# Patient Record
Sex: Male | Born: 1970 | Race: Black or African American | Hispanic: No | Marital: Married | Smoking: Former smoker
Health system: Southern US, Community
[De-identification: ages and names within clinical notes are randomized; demographics above are authoritative.]

## PROBLEM LIST (undated history)

## (undated) DIAGNOSIS — R110 Nausea: Secondary | ICD-10-CM

## (undated) DIAGNOSIS — K5000 Crohn's disease of small intestine without complications: Secondary | ICD-10-CM

## (undated) DIAGNOSIS — M6281 Muscle weakness (generalized): Secondary | ICD-10-CM

## (undated) DIAGNOSIS — K644 Residual hemorrhoidal skin tags: Secondary | ICD-10-CM

## (undated) DIAGNOSIS — K219 Gastro-esophageal reflux disease without esophagitis: Secondary | ICD-10-CM

## (undated) DIAGNOSIS — Z9189 Other specified personal risk factors, not elsewhere classified: Secondary | ICD-10-CM

## (undated) DIAGNOSIS — K2970 Gastritis, unspecified, without bleeding: Secondary | ICD-10-CM

## (undated) DIAGNOSIS — F419 Anxiety disorder, unspecified: Secondary | ICD-10-CM

## (undated) DIAGNOSIS — F329 Major depressive disorder, single episode, unspecified: Secondary | ICD-10-CM

## (undated) DIAGNOSIS — R109 Unspecified abdominal pain: Secondary | ICD-10-CM

## (undated) DIAGNOSIS — M1990 Unspecified osteoarthritis, unspecified site: Secondary | ICD-10-CM

## (undated) DIAGNOSIS — R202 Paresthesia of skin: Secondary | ICD-10-CM

## (undated) DIAGNOSIS — R200 Anesthesia of skin: Secondary | ICD-10-CM

## (undated) DIAGNOSIS — S92902A Unspecified fracture of left foot, initial encounter for closed fracture: Secondary | ICD-10-CM

## (undated) DIAGNOSIS — F32A Depression, unspecified: Secondary | ICD-10-CM

## (undated) DIAGNOSIS — J309 Allergic rhinitis, unspecified: Secondary | ICD-10-CM

## (undated) DIAGNOSIS — Z227 Latent tuberculosis: Secondary | ICD-10-CM

## (undated) DIAGNOSIS — N200 Calculus of kidney: Secondary | ICD-10-CM

## (undated) DIAGNOSIS — F1911 Other psychoactive substance abuse, in remission: Secondary | ICD-10-CM

## (undated) DIAGNOSIS — R51 Headache: Secondary | ICD-10-CM

## (undated) DIAGNOSIS — S82899A Other fracture of unspecified lower leg, initial encounter for closed fracture: Secondary | ICD-10-CM

## (undated) DIAGNOSIS — K648 Other hemorrhoids: Secondary | ICD-10-CM

## (undated) DIAGNOSIS — F1910 Other psychoactive substance abuse, uncomplicated: Secondary | ICD-10-CM

## (undated) HISTORY — DX: Other psychoactive substance abuse, uncomplicated: F19.10

## (undated) HISTORY — DX: Anxiety disorder, unspecified: F41.9

## (undated) HISTORY — DX: Other hemorrhoids: K64.8

## (undated) HISTORY — PX: TONSILLECTOMY: SUR1361

## (undated) HISTORY — DX: Depression, unspecified: F32.A

## (undated) HISTORY — DX: Other specified personal risk factors, not elsewhere classified: Z91.89

## (undated) HISTORY — PX: MOUTH SURGERY: SHX715

## (undated) HISTORY — DX: Gastritis, unspecified, without bleeding: K29.70

## (undated) HISTORY — DX: Residual hemorrhoidal skin tags: K64.4

## (undated) HISTORY — DX: Calculus of kidney: N20.0

## (undated) HISTORY — DX: Latent tuberculosis: Z22.7

## (undated) HISTORY — DX: Other psychoactive substance abuse, in remission: F19.11

## (undated) HISTORY — DX: Major depressive disorder, single episode, unspecified: F32.9

## (undated) HISTORY — DX: Unspecified osteoarthritis, unspecified site: M19.90

## (undated) HISTORY — DX: Crohn's disease of small intestine without complications: K50.00

## (undated) HISTORY — DX: Unspecified fracture of left foot, initial encounter for closed fracture: S92.902A

## (undated) HISTORY — PX: FOOT SURGERY: SHX648

---

## 2002-10-27 DIAGNOSIS — Z9189 Other specified personal risk factors, not elsewhere classified: Secondary | ICD-10-CM

## 2002-10-27 HISTORY — DX: Other specified personal risk factors, not elsewhere classified: Z91.89

## 2002-11-17 ENCOUNTER — Emergency Department (HOSPITAL_COMMUNITY): Admission: EM | Admit: 2002-11-17 | Discharge: 2002-11-17 | Payer: Self-pay | Admitting: Emergency Medicine

## 2002-11-17 ENCOUNTER — Inpatient Hospital Stay (HOSPITAL_COMMUNITY): Admission: EM | Admit: 2002-11-17 | Discharge: 2002-11-22 | Payer: Self-pay | Admitting: Psychiatry

## 2002-12-18 ENCOUNTER — Emergency Department (HOSPITAL_COMMUNITY): Admission: EM | Admit: 2002-12-18 | Discharge: 2002-12-18 | Payer: Self-pay | Admitting: Emergency Medicine

## 2003-01-23 ENCOUNTER — Emergency Department (HOSPITAL_COMMUNITY): Admission: EM | Admit: 2003-01-23 | Discharge: 2003-01-23 | Payer: Self-pay | Admitting: Emergency Medicine

## 2003-02-01 ENCOUNTER — Emergency Department (HOSPITAL_COMMUNITY): Admission: EM | Admit: 2003-02-01 | Discharge: 2003-02-01 | Payer: Self-pay | Admitting: Emergency Medicine

## 2003-03-26 ENCOUNTER — Emergency Department (HOSPITAL_COMMUNITY): Admission: EM | Admit: 2003-03-26 | Discharge: 2003-03-26 | Payer: Self-pay | Admitting: Emergency Medicine

## 2007-01-27 HISTORY — PX: OTHER SURGICAL HISTORY: SHX169

## 2007-02-07 ENCOUNTER — Emergency Department (HOSPITAL_COMMUNITY): Admission: EM | Admit: 2007-02-07 | Discharge: 2007-02-07 | Payer: Self-pay | Admitting: Emergency Medicine

## 2007-05-26 ENCOUNTER — Emergency Department (HOSPITAL_COMMUNITY): Admission: EM | Admit: 2007-05-26 | Discharge: 2007-05-26 | Payer: Self-pay | Admitting: Emergency Medicine

## 2007-10-06 ENCOUNTER — Emergency Department (HOSPITAL_COMMUNITY): Admission: EM | Admit: 2007-10-06 | Discharge: 2007-10-06 | Payer: Self-pay | Admitting: Emergency Medicine

## 2007-10-14 ENCOUNTER — Emergency Department (HOSPITAL_COMMUNITY): Admission: EM | Admit: 2007-10-14 | Discharge: 2007-10-14 | Payer: Self-pay | Admitting: Family Medicine

## 2008-02-06 ENCOUNTER — Ambulatory Visit: Payer: Self-pay | Admitting: *Deleted

## 2008-02-07 ENCOUNTER — Inpatient Hospital Stay (HOSPITAL_COMMUNITY): Admission: AC | Admit: 2008-02-07 | Discharge: 2008-02-08 | Payer: Self-pay

## 2008-04-23 ENCOUNTER — Emergency Department (HOSPITAL_COMMUNITY): Admission: EM | Admit: 2008-04-23 | Discharge: 2008-04-24 | Payer: Self-pay | Admitting: Emergency Medicine

## 2008-10-02 ENCOUNTER — Emergency Department (HOSPITAL_COMMUNITY): Admission: EM | Admit: 2008-10-02 | Discharge: 2008-10-02 | Payer: Self-pay | Admitting: Emergency Medicine

## 2009-08-25 ENCOUNTER — Emergency Department (HOSPITAL_COMMUNITY): Admission: EM | Admit: 2009-08-25 | Discharge: 2009-08-25 | Payer: Self-pay | Admitting: Emergency Medicine

## 2009-12-29 ENCOUNTER — Encounter (INDEPENDENT_AMBULATORY_CARE_PROVIDER_SITE_OTHER): Payer: Self-pay | Admitting: *Deleted

## 2009-12-29 ENCOUNTER — Emergency Department (HOSPITAL_COMMUNITY)
Admission: EM | Admit: 2009-12-29 | Discharge: 2009-12-29 | Payer: Self-pay | Source: Home / Self Care | Admitting: Emergency Medicine

## 2009-12-31 ENCOUNTER — Emergency Department (HOSPITAL_COMMUNITY)
Admission: EM | Admit: 2009-12-31 | Discharge: 2009-12-31 | Payer: Self-pay | Source: Home / Self Care | Admitting: Emergency Medicine

## 2009-12-31 ENCOUNTER — Encounter (INDEPENDENT_AMBULATORY_CARE_PROVIDER_SITE_OTHER): Payer: Self-pay | Admitting: *Deleted

## 2010-01-03 ENCOUNTER — Encounter (INDEPENDENT_AMBULATORY_CARE_PROVIDER_SITE_OTHER): Payer: Self-pay | Admitting: *Deleted

## 2010-01-26 DIAGNOSIS — K5000 Crohn's disease of small intestine without complications: Secondary | ICD-10-CM

## 2010-01-26 HISTORY — DX: Crohn's disease of small intestine without complications: K50.00

## 2010-02-13 ENCOUNTER — Ambulatory Visit: Admit: 2010-02-13 | Payer: Self-pay | Admitting: Internal Medicine

## 2010-02-25 NOTE — Letter (Signed)
Summary: New Patient letter  [facility]  [address] [place], [place] [address]   Phone: [phone]  Fax: [phone]       01/03/2010 MRN: [id number]    Gary Barton [address], [place]  [address]  Dear Mr. Omahoney,  Welcome to the Gastroenterology Division at [facility].    You are scheduled to see Dr. Leone Payor on February 13, 2010 at 1:45 P.M. on the 3rd floor at [facility], [address].  We ask that you try to arrive at our office 15 minutes prior to your appointment time to allow for check-in.  We would like you to complete the enclosed self-administered evaluation form prior to your visit and bring it with you on the day of your appointment.  We will review it with you.  Also, please bring a complete list of all your medications or, if you prefer, bring the medication bottles and we will list them.  Please bring your insurance card so that we may make a copy of it.  If your insurance requires a referral to see a specialist, please bring your referral form from your primary care physician.  Co-payments are due at the time of your visit and may be paid by cash, check or credit card.     Your office visit will consist of a consult with your physician (includes a physical exam), any laboratory testing he/she may order, scheduling of any necessary diagnostic testing (e.g. x-ray, ultrasound, CT-scan), and scheduling of a procedure (e.g. Endoscopy, Colonoscopy) if required.  Please allow enough time on your schedule to allow for any/all of these possibilities.    If you cannot keep your appointment, please call [phone] to cancel or reschedule prior to your appointment date.  This allows [place] the opportunity to schedule an appointment for another patient in need of care.  If you do not cancel or reschedule by 5 p.m. the business day prior to your appointment date, you will be charged a $50.00 late cancellation/no-show fee.    Thank you  for choosing [facility] for your medical needs.  We appreciate the opportunity to care for you.  Please visit [place] at our website  to learn more about our practice.                     Sincerely,                                                             The Gastroenterology Division

## 2010-04-07 LAB — URINALYSIS, ROUTINE W REFLEX MICROSCOPIC
Bilirubin Urine: NEGATIVE
Bilirubin Urine: NEGATIVE
Glucose, UA: NEGATIVE mg/dL
Glucose, UA: NEGATIVE mg/dL
Hgb urine dipstick: NEGATIVE
Hgb urine dipstick: NEGATIVE
Ketones, ur: NEGATIVE mg/dL
Ketones, ur: NEGATIVE mg/dL
Nitrite: NEGATIVE
Nitrite: NEGATIVE
Protein, ur: NEGATIVE mg/dL
Protein, ur: NEGATIVE mg/dL
Specific Gravity, Urine: 1.012 (ref 1.005–1.030)
Specific Gravity, Urine: 1.016 (ref 1.005–1.030)
Urobilinogen, UA: 0.2 mg/dL (ref 0.0–1.0)
Urobilinogen, UA: 1 mg/dL (ref 0.0–1.0)
pH: 7.5 (ref 5.0–8.0)
pH: 7.5 (ref 5.0–8.0)

## 2010-04-07 LAB — DIFFERENTIAL
Basophils Absolute: 0 10*3/uL (ref 0.0–0.1)
Basophils Absolute: 0.1 10*3/uL (ref 0.0–0.1)
Basophils Relative: 1 % (ref 0–1)
Basophils Relative: 1 % (ref 0–1)
Eosinophils Absolute: 0.4 10*3/uL (ref 0.0–0.7)
Eosinophils Absolute: 0.4 10*3/uL (ref 0.0–0.7)
Eosinophils Relative: 6 % — ABNORMAL HIGH (ref 0–5)
Eosinophils Relative: 9 % — ABNORMAL HIGH (ref 0–5)
Lymphocytes Relative: 38 % (ref 12–46)
Lymphocytes Relative: 42 % (ref 12–46)
Lymphs Abs: 1.7 10*3/uL (ref 0.7–4.0)
Lymphs Abs: 2.4 10*3/uL (ref 0.7–4.0)
Monocytes Absolute: 0.5 10*3/uL (ref 0.1–1.0)
Monocytes Absolute: 0.6 10*3/uL (ref 0.1–1.0)
Monocytes Relative: 12 % (ref 3–12)
Monocytes Relative: 9 % (ref 3–12)
Neutro Abs: 1.6 10*3/uL — ABNORMAL LOW (ref 1.7–7.7)
Neutro Abs: 2.9 10*3/uL (ref 1.7–7.7)
Neutrophils Relative %: 38 % — ABNORMAL LOW (ref 43–77)
Neutrophils Relative %: 46 % (ref 43–77)

## 2010-04-07 LAB — COMPREHENSIVE METABOLIC PANEL
ALT: 14 U/L (ref 0–53)
ALT: 16 U/L (ref 0–53)
AST: 20 U/L (ref 0–37)
AST: 25 U/L (ref 0–37)
Albumin: 4 g/dL (ref 3.5–5.2)
Albumin: 4.1 g/dL (ref 3.5–5.2)
Alkaline Phosphatase: 62 U/L (ref 39–117)
Alkaline Phosphatase: 67 U/L (ref 39–117)
BUN: 9 mg/dL (ref 6–23)
BUN: 9 mg/dL (ref 6–23)
CO2: 27 mEq/L (ref 19–32)
CO2: 29 mEq/L (ref 19–32)
Calcium: 9.1 mg/dL (ref 8.4–10.5)
Calcium: 9.6 mg/dL (ref 8.4–10.5)
Chloride: 104 mEq/L (ref 96–112)
Chloride: 105 mEq/L (ref 96–112)
Creatinine, Ser: 1.18 mg/dL (ref 0.4–1.5)
Creatinine, Ser: 1.2 mg/dL (ref 0.4–1.5)
GFR calc Af Amer: >60 mL/min (ref >60)
GFR calc Af Amer: >60 mL/min (ref >60)
GFR calc non Af Amer: >60 mL/min (ref >60)
GFR calc non Af Amer: >60 mL/min (ref >60)
Glucose, Bld: 88 mg/dL (ref 70–99)
Glucose, Bld: 91 mg/dL (ref 70–99)
Potassium: 4.3 mEq/L (ref 3.5–5.1)
Potassium: 4.4 mEq/L (ref 3.5–5.1)
Sodium: 140 mEq/L (ref 135–145)
Sodium: 141 mEq/L (ref 135–145)
Total Bilirubin: 0.4 mg/dL (ref 0.3–1.2)
Total Bilirubin: 0.5 mg/dL (ref 0.3–1.2)
Total Protein: 6.5 g/dL (ref 6.0–8.3)
Total Protein: 7 g/dL (ref 6.0–8.3)

## 2010-04-07 LAB — CBC
HCT: 37.8 % — ABNORMAL LOW (ref 39.0–52.0)
HCT: 38.1 % — ABNORMAL LOW (ref 39.0–52.0)
Hemoglobin: 12.8 g/dL — ABNORMAL LOW (ref 13.0–17.0)
Hemoglobin: 13.2 g/dL (ref 13.0–17.0)
MCH: 31.1 pg (ref 26.0–34.0)
MCH: 31.7 pg (ref 26.0–34.0)
MCHC: 33.9 g/dL (ref 30.0–36.0)
MCHC: 34.6 g/dL (ref 30.0–36.0)
MCV: 91.4 fL (ref 78.0–100.0)
MCV: 91.7 fL (ref 78.0–100.0)
Platelets: 187 10*3/uL (ref 150–400)
Platelets: 194 10*3/uL (ref 150–400)
RBC: 4.12 MIL/uL — ABNORMAL LOW (ref 4.22–5.81)
RBC: 4.17 MIL/uL — ABNORMAL LOW (ref 4.22–5.81)
RDW: 14.1 % (ref 11.5–15.5)
RDW: 14.2 % (ref 11.5–15.5)
WBC: 4.2 10*3/uL (ref 4.0–10.5)
WBC: 6.3 10*3/uL (ref 4.0–10.5)

## 2010-04-07 LAB — LIPASE, BLOOD: Lipase: 24 U/L (ref 11–59)

## 2010-04-07 LAB — HEMOCCULT GUIAC POC 1CARD (OFFICE): Fecal Occult Bld: NEGATIVE

## 2010-04-12 LAB — DIFFERENTIAL
Lymphocytes Relative: 34 % (ref 12–46)
Monocytes Absolute: 0.9 10*3/uL (ref 0.1–1.0)
Monocytes Relative: 15 % — ABNORMAL HIGH (ref 3–12)
Neutro Abs: 2.7 10*3/uL (ref 1.7–7.7)
Neutrophils Relative %: 44 % (ref 43–77)

## 2010-04-12 LAB — URINE MICROSCOPIC-ADD ON

## 2010-04-12 LAB — URINALYSIS, ROUTINE W REFLEX MICROSCOPIC
Bilirubin Urine: NEGATIVE
Glucose, UA: NEGATIVE mg/dL
Ketones, ur: NEGATIVE mg/dL
Leukocytes, UA: NEGATIVE
Nitrite: NEGATIVE
Protein, ur: NEGATIVE mg/dL
Specific Gravity, Urine: 1.028 (ref 1.005–1.030)
Urobilinogen, UA: 0.2 mg/dL (ref 0.0–1.0)
pH: 6 (ref 5.0–8.0)

## 2010-04-12 LAB — CBC
HCT: 39.7 % (ref 39.0–52.0)
Hemoglobin: 13.7 g/dL (ref 13.0–17.0)
RBC: 4.19 MIL/uL — ABNORMAL LOW (ref 4.22–5.81)
WBC: 6.2 10*3/uL (ref 4.0–10.5)

## 2010-05-12 LAB — CBC
HCT: 35.1 % — ABNORMAL LOW (ref 39.0–52.0)
Hemoglobin: 11.8 g/dL — ABNORMAL LOW (ref 13.0–17.0)
MCHC: 33.6 g/dL (ref 30.0–36.0)
MCV: 95.3 fL (ref 78.0–100.0)
Platelets: 190 10*3/uL (ref 150–400)
RBC: 3.49 MIL/uL — ABNORMAL LOW (ref 4.22–5.81)
RBC: 3.73 MIL/uL — ABNORMAL LOW (ref 4.22–5.81)
RDW: 13.8 % (ref 11.5–15.5)
WBC: 12.6 10*3/uL — ABNORMAL HIGH (ref 4.0–10.5)

## 2010-05-12 LAB — DIFFERENTIAL
Eosinophils Absolute: 0.1 10*3/uL (ref 0.0–0.7)
Lymphocytes Relative: 15 % (ref 12–46)
Lymphs Abs: 1.9 10*3/uL (ref 0.7–4.0)
Monocytes Relative: 9 % (ref 3–12)
Neutro Abs: 9.5 10*3/uL — ABNORMAL HIGH (ref 1.7–7.7)
Neutrophils Relative %: 75 % (ref 43–77)

## 2010-05-12 LAB — TYPE AND SCREEN: ABO/RH(D): A POS

## 2010-05-12 LAB — BASIC METABOLIC PANEL
BUN: 6 mg/dL (ref 6–23)
Calcium: 8.5 mg/dL (ref 8.4–10.5)
Creatinine, Ser: 1 mg/dL (ref 0.4–1.5)
GFR calc Af Amer: >60 mL/min (ref >60)
GFR calc non Af Amer: >60 mL/min (ref >60)

## 2010-05-12 LAB — PROTIME-INR: INR: 1 (ref 0.00–1.49)

## 2010-05-12 LAB — POCT I-STAT, CHEM 8
Calcium, Ion: 1.16 mmol/L (ref 1.12–1.32)
Chloride: 105 meq/L (ref 96–112)
Glucose, Bld: 95 mg/dL (ref 70–99)
HCT: 38 % — ABNORMAL LOW (ref 39.0–52.0)
Hemoglobin: 12.9 g/dL — ABNORMAL LOW (ref 13.0–17.0)
TCO2: 22 mmol/L (ref 0–100)

## 2010-05-12 LAB — ABO/RH: ABO/RH(D): A POS
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
## 2010-06-13 NOTE — Discharge Summary (Signed)
NAME:  Gary Barton, Gary Barton                        ACCOUNT NO.:  [id number]   MEDICAL RECORD NO.:  [id number]                   PATIENT TYPE:  IPS   LOCATION:  0505                                 FACILITY:  [facility]   PHYSICIAN:  Geoffery Lyons, M.D.                   DATE OF BIRTH:  04/07/1970   DATE OF ADMISSION:  11/17/2002  DATE OF DISCHARGE:  11/22/2002                                 DISCHARGE SUMMARY   CHIEF COMPLAINT AND PRESENT ILLNESS:  This was the first admission to [facility] for this 40 year old single black male involuntarily  committed.  History of overdose on seven Xanax and 22 Benadryl at home prior  to this admission.  He states his girlfriend and children were there.  It  was an impulsive act.  He has been feeling very stressed out over his  finances, overwhelmed, main responsibility was caring for his own to his  children and four other children from his girlfriend.  He was recently  released from prison after seven years.  He claimed that he was doing his  best to get his things together.  His intention with the overdose was to get  away.  Claimed no intention to kill himself.  Felt depressed.  Denied any  drug use.   PAST PSYCHIATRIC HISTORY:  First time at [facility].  Made an  appointment at mental health and did not follow up.   ALCOHOL/DRUG HISTORY:  Denies any alcohol use.  Denies any other substance  use, although urine drug screen is positive for cocaine, marijuana and  benzodiazepines.   PAST MEDICAL HISTORY:  Noncontributory.   PHYSICAL EXAMINATION:  Performed and failed to show any acute findings.   MENTAL STATUS EXAM:  Alert, young, black male.  Cooperative.  Fair eye  contact.  Speech is clear.  Mood is depressed and overwhelmed.  Affect is  tearful, especially when told that he needed to stay and continue to get  stabilized before he could be discharged.  Thought processes are coherent.  No evidence of psychosis.   Cognition well-preserved.   ADMISSION DIAGNOSES:   AXIS I:  1. Depressive disorder not otherwise specified.  2. Polysubstance abuse.   AXIS II:  No diagnosis.   AXIS III:  No diagnosis.   AXIS IV:  Moderate.   AXIS V:  Global Assessment of Functioning upon admission 30; highest Global  Assessment of Functioning in the last year 60.   LABORATORY DATA:  CBC within normal limits.  CMET with potassium 3.2.  Urine  drug screen positive for cocaine, marijuana and benzodiazepines.   HOSPITAL COURSE:  He was admitted and started intensive individual and group  psychotherapy.  He was given some Librium as needed for withdrawal, Ambien  for sleep and Vistaril for anxiety.  Was started on Lexapro 5 mg per day.  That was increased up to 10 mg per  day.  Admitted to the multiple stressful  events, overwhelmed, took the overdose.  A session with the girlfriend was  arranged but she did not show.  He was very active in the unit, opening up,  discussing his issues.  Upset when the girlfriend did not show up, realized  that he could not go on with that relationship.  He was wanting to be a good  father.  He was working towards getting his biological daughter out of the  house.  On November 22, 2002, he was in full contact with reality.  There  were no suicidal ideation.  No homicidal ideation.  No delusions.  No  hallucinations.  Much improved.  Increased insight.  Committed to  abstinence.  He will stay with his mother and girlfriend will eventually  leave the house and find a place with his daughter.   DISCHARGE DIAGNOSES:   AXIS I:  1. Depressive disorder not otherwise specified.  2. Polysubstance abuse.   AXIS II:  No diagnosis.   AXIS III:  No diagnosis.   AXIS IV:  Moderate.   AXIS V:  Global Assessment of Functioning upon discharge 55-60.   DISCHARGE MEDICATIONS:  1. Lexapro 10 mg per day.  2. Ambien 10 mg as needed for sleep.   FOLLOW UP:  CD IOP.                                                Geoffery Lyons, M.D.    IL/MEDQ  D:  12/22/2002  T:  12/22/2002  Job:  [id number]
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
## 2010-10-21 LAB — URINALYSIS, ROUTINE W REFLEX MICROSCOPIC
Glucose, UA: NEGATIVE
Leukocytes, UA: NEGATIVE
Protein, ur: NEGATIVE
Specific Gravity, Urine: 1.013

## 2010-10-21 LAB — URINE MICROSCOPIC-ADD ON

## 2010-10-21 LAB — URINE CULTURE

## 2010-10-29 LAB — PROTIME-INR: Prothrombin Time: 13

## 2010-10-29 LAB — SAMPLE TO BLOOD BANK

## 2010-10-29 LAB — CBC
Platelets: 195
RBC: 4.1 — ABNORMAL LOW
WBC: 9.4

## 2010-11-05 ENCOUNTER — Encounter: Payer: Self-pay | Admitting: Internal Medicine

## 2010-11-06 ENCOUNTER — Other Ambulatory Visit: Payer: Self-pay | Admitting: Family Medicine

## 2010-11-06 DIAGNOSIS — K921 Melena: Secondary | ICD-10-CM

## 2010-11-07 ENCOUNTER — Ambulatory Visit
Admission: RE | Admit: 2010-11-07 | Discharge: 2010-11-07 | Disposition: A | Discharge status: Home / Self Care | Payer: Medicaid Other | Source: Ambulatory Visit | Attending: Family Medicine | Admitting: Family Medicine

## 2010-11-07 DIAGNOSIS — K921 Melena: Secondary | ICD-10-CM

## 2010-11-07 MED ORDER — IOHEXOL 300 MG/ML  SOLN
100.0000 mL | Freq: Once | INTRAMUSCULAR | Status: AC | PRN
  Administered 2010-11-07: 100 mL via INTRAVENOUS

## 2010-11-14 ENCOUNTER — Ambulatory Visit (INDEPENDENT_AMBULATORY_CARE_PROVIDER_SITE_OTHER): Payer: Medicaid Other | Admitting: Internal Medicine

## 2010-11-14 ENCOUNTER — Encounter: Payer: Self-pay | Admitting: Internal Medicine

## 2010-11-14 VITALS — BP 100/64 | HR 80 | Ht 68.0 in | Wt 171.4 lb

## 2010-11-14 DIAGNOSIS — K921 Melena: Secondary | ICD-10-CM

## 2010-11-14 DIAGNOSIS — R12 Heartburn: Secondary | ICD-10-CM

## 2010-11-14 DIAGNOSIS — R1031 Right lower quadrant pain: Secondary | ICD-10-CM

## 2010-11-14 MED ORDER — DICYCLOMINE HCL 20 MG PO TABS: 20.0000 mg | ORAL_TABLET | Freq: Four times a day (QID) | ORAL | Status: DC

## 2010-11-14 MED ORDER — PEG-KCL-NACL-NASULF-NA ASC-C 100 G PO SOLR: 1.0000 | Freq: Once | ORAL | Status: DC

## 2010-11-14 NOTE — Progress Notes (Signed)
Subjective:    Patient ID: Gary Barton, male    DOB: 21-Jan-1971, 40 y.o.   MRN: [id number]  HPI this young [place] American man describes chronic problems with abdominal pain. However in the last 1-1/2 years or so he has developed intermittent passage of blood in the stool. Usually red but it sounds like there has been some melena. He is a chronic right lower quadrant pain and pressure that intensifies becomes crampy and is associated with difficult defecation and straining to stool. He also blows after eating and has a lot of nausea and reflux and regurgitation type problems and maybe even vomits at times. Generally feels miserable overall. Progressively worse over time.  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 "Raised on goat milk". Relates a history of chronic abdominal pain as a child/teen. Intermittent problems have become more frequent over time and especially in past 1-2 years. Pain will disturb sleep. Gagging and vomiting, belches frequently. 5 minutes after eating gets swollen and bloated and tight in abdomen. Gets urge to defecate but unable to. Feels a constant pressure in lower abdomen. Slight weight loss - 10# in 5 months. Has tried Zantac - no help.  Past Medical History  Diagnosis Date  . Anxiety and depression   . History of mixed drug abuse     marijuana - EtOH  . History of drug overdose     on Xanax  . Chronic headaches   . Nephrolithiasis    Past Surgical History  Procedure Date  . Right arm surgery   . Tonsillectomy     reports that he quit smoking about 21 months ago. He has never used smokeless tobacco. He reports that he drinks alcohol. He reports that he uses illicit drugs (Marijuana). family history includes Aneurysm in his mother; Cancer in his maternal aunt; Emphysema in his maternal grandmother; Stroke in his mother; and Thyroid disease in his mother. No Known Allergies      Review of Systems Also complains of night sweats. History of kidney  stones these are not active now. All other review of systems negative.    Objective:   Physical Exam General: Well-developed, well-nourished and in no acute distress Vitals: Reviewed and listed above Eyes:anicteric. Mouth and posterior pharynx: poor dentition, no mucosal lesions.  Neck: supple w/o thyromegaly or mass.  Lungs: clear. Heart: S1S2, no rubs, murmurs, gallops. Abdomen: soft, non-tender, no hepatosplenomegaly, hernia, or mass and BS+.  Rectal:  deferred Lymphatics: no cervical, [place] or inguinal nodes. Extremities:  no edema Skin no rash. Numerous tattoos Neuro: nonfocal. A&O x 3.  Psych: appropriate mood and  affect.        Assessment & Plan:  He has a constellation of abdominal pain in the right lower quadrant, difficult defecation, rectal bleeding with hematochezia and possible melena. Also heartburn, postprandial bloating and regurgitation. CT scanning of the abdomen and pelvis recently showed no abnormality other than a kidney stone. Recent CBC, celiac testing, H. pylori antibody and copper to metabolic panel were all normal.  Overall the main differential comes down to whether or not this is irritable bowel syndrome with anorectal bleeding versus an inflammatory bowel disease I think. I favor the former but we really can't tell if it's appropriate for an upper GI endoscopy and colonoscopy. Risks benefits and indications were explained. Given his underlying severe anxiety and issues I think it would be best for him to have this under deep sedation with propofol will plan that way. Risks benefits and  indications of the procedures have been explained and he understands and agrees to proceed. Wife present for the conversation. I will go ahead and prescribe an anti-spasmodic for the time being he can try the trial of dicyclomine. Further plans pending the endoscopic results and clinical course.

## 2010-11-14 NOTE — Patient Instructions (Signed)
You have been scheduled for an Endoscopy/Colonoscopy with separate instructions given. Your prep kit has been sent to your pharmacy for you to pick up. Your prescription(s) has(have) been sent to your pharmacy for you to pick up (Dicyclomine).  

## 2010-11-25 ENCOUNTER — Encounter: Payer: Self-pay | Admitting: Internal Medicine

## 2010-11-25 ENCOUNTER — Ambulatory Visit (AMBULATORY_SURGERY_CENTER): Payer: Medicaid Other | Admitting: Internal Medicine

## 2010-11-25 VITALS — BP 113/80 | HR 53 | Temp 97.3°F | Resp 20 | Ht 68.0 in | Wt 171.0 lb

## 2010-11-25 DIAGNOSIS — R1031 Right lower quadrant pain: Secondary | ICD-10-CM

## 2010-11-25 DIAGNOSIS — K5190 Ulcerative colitis, unspecified, without complications: Secondary | ICD-10-CM

## 2010-11-25 DIAGNOSIS — K2940 Chronic atrophic gastritis without bleeding: Secondary | ICD-10-CM

## 2010-11-25 DIAGNOSIS — K921 Melena: Secondary | ICD-10-CM

## 2010-11-25 DIAGNOSIS — R12 Heartburn: Secondary | ICD-10-CM

## 2010-11-25 DIAGNOSIS — R933 Abnormal findings on diagnostic imaging of other parts of digestive tract: Secondary | ICD-10-CM

## 2010-11-25 HISTORY — PX: UPPER GASTROINTESTINAL ENDOSCOPY: SHX188

## 2010-11-25 HISTORY — PX: COLONOSCOPY W/ BIOPSIES: SHX1374

## 2010-11-25 MED ORDER — SODIUM CHLORIDE 0.9 % IV SOLN: 500.0000 mL | INTRAVENOUS | Status: DC

## 2010-11-25 NOTE — Progress Notes (Signed)
Propofol administered by s camp crna per protocol. Please see scanned intra procedure report. ewm

## 2010-11-25 NOTE — Patient Instructions (Addendum)
1) The stomach looked mildly inflamed = gastritis. It may or may not be related to your symptoms. Biopsies were taken. Will let you know about the results and plans with a phone call. 2) The ileum or end of small intestine looks inflamed and it was biopsied will call results and plans. 3) Part of the colon also looked inflamed and was biopsied, also. 4) you have internal hemorrhoids causing your bleeding. 5) Stop the dicyclomine if it did not help.  Please follow all discharge instructions given to you by the recovery room nurse. If you have any questions or problems after discharge please call [phone]. You will receive a phone call in the am to see how you are doing and answer any questions you may have. Thank you for choosing [facility] for your health care needs.

## 2010-11-26 ENCOUNTER — Telehealth: Payer: Self-pay | Admitting: *Deleted

## 2010-11-26 NOTE — Telephone Encounter (Signed)
Follow up Call- Patient questions:  Do you have a fever, pain , or abdominal swelling? yes Pain Score  3 *co same pain abdomen he had prior to procedure yesterday  Have you tolerated food without any problems? yes  Have you been able to return to your normal activities? yes  Do you have any questions about your discharge instructions: Diet   no Medications  no Follow up visit  no  Do you have questions or concerns about your Care? no  Actions: * If pain score is 4 or above: No action needed, pain <4.

## 2010-12-03 ENCOUNTER — Other Ambulatory Visit: Payer: Self-pay | Admitting: Internal Medicine

## 2010-12-03 DIAGNOSIS — K5000 Crohn's disease of small intestine without complications: Secondary | ICD-10-CM

## 2010-12-03 MED ORDER — BUDESONIDE 3 MG PO CP24: 9.0000 mg | ORAL_CAPSULE | ORAL | Status: DC

## 2010-12-12 ENCOUNTER — Telehealth: Payer: Self-pay | Admitting: Internal Medicine

## 2010-12-12 MED ORDER — MESALAMINE 1000 MG RE SUPP: 1000.0000 mg | Freq: Every day | RECTAL | Status: DC

## 2010-12-12 MED ORDER — LIDOCAINE 5 % EX OINT: TOPICAL_OINTMENT | CUTANEOUS | Status: DC | PRN

## 2010-12-12 NOTE — Telephone Encounter (Signed)
Patient notified

## 2010-12-12 NOTE — Telephone Encounter (Signed)
Patient c/o rectal bleeding and rectal pain he has been using Perep H with little help.  He has also tried sitz baths as recommended by Dr Jarold Motto.  Discussed with Dr Sheryn Bison patient to try canasa supp q HS and lidocaine ointment .  Left message for patient to call back

## 2010-12-12 NOTE — Telephone Encounter (Signed)
Patient c/o rectal bleeding and rectal pain.  He is trying OTC prep H and

## 2010-12-30 ENCOUNTER — Encounter: Payer: Self-pay | Admitting: Internal Medicine

## 2010-12-30 ENCOUNTER — Ambulatory Visit (INDEPENDENT_AMBULATORY_CARE_PROVIDER_SITE_OTHER): Payer: Self-pay | Admitting: Internal Medicine

## 2010-12-30 VITALS — BP 110/64 | HR 88 | Ht 67.0 in | Wt 172.0 lb

## 2010-12-30 DIAGNOSIS — K921 Melena: Secondary | ICD-10-CM

## 2010-12-30 DIAGNOSIS — K5000 Crohn's disease of small intestine without complications: Secondary | ICD-10-CM

## 2010-12-30 MED ORDER — BUDESONIDE 3 MG PO CP24: 9.0000 mg | ORAL_CAPSULE | ORAL | Status: DC

## 2010-12-30 MED ORDER — POLYETHYLENE GLYCOL 3350 17 GM/SCOOP PO POWD: 17.0000 g | Freq: Every day | ORAL | Status: AC

## 2010-12-30 NOTE — Progress Notes (Signed)
Subjective:    Patient ID: Gary Barton, male    DOB: [birth year], 40 y.o.   MRN: [id number]  HPI 40 year old [place] American man here with his wife for followup. He had complaints of abdominal pain nausea vomiting and blood in the stool. Colonoscopy raised the question of Crohn's ileitis. A trial of Entocort was given. His symptoms are listed below. Intermittent bright red blood and/or melena and mucous Insomnia Sweats Stools are flat, not runny or loose. Feels constipated as he has to strain to stool.Incomplete defecation with recurrent defecation. Some nausea and vomiting at times, 4 episodes of vomiting in last 2 weeks. Still has abdominal pain in the infraumbilical area which is frequent but intermittent. Defecation relieves a lot of "stomach pressure". No Known Allergies Outpatient Prescriptions Prior to Visit  Medication Sig Dispense Refill  . dicyclomine (BENTYL) 20 MG tablet Take 1 tablet (20 mg total) by mouth every 6 (six) hours. Before meals and at bedtme  120 tablet  0  . lidocaine (XYLOCAINE) 5 % ointment Apply topically as needed. To rectum  37.5 g  0  . mesalamine (CANASA) 1000 MG suppository Place 1 suppository (1,000 mg total) rectally at bedtime.  30 suppository  1  . budesonide (ENTOCORT EC) 3 MG 24 hr capsule Take 3 capsules (9 mg total) by mouth every morning.  90 capsule  1   Past Medical History  Diagnosis Date  . Anxiety and depression   . History of mixed drug abuse     marijuana - EtOH  . History of drug overdose     on Xanax  . Chronic headaches   . Nephrolithiasis   . Allergy     SINUS  . Anxiety   . Depression   . Arthritis     HAND  . Asthma     AS CHILD  . Neuromuscular disorder     NERVE/MUSCLE CUT AND REPLACED  . Substance abuse   . Crohn's disease     ?  [name] Internal hemorrhoids   . Gastritis    Past Surgical History  Procedure Date  . Right arm surgery   . Tonsillectomy   . Colonoscopy w/ biopsies 11/25/2010    internal  hemorrhoids, Crohn's ileitis suspected  . Upper gastrointestinal endoscopy 11/25/2010    gastritis   History   Social History  . Marital Status: Married    Spouse Name: N/A    Number of Children: 2  . Years of Education: N/A   Occupational History  . unemployed    Social History Main Topics  . Smoking status: Former Smoker    Quit date: 01/26/2009  . Smokeless tobacco: Never Used  . Alcohol Use: Yes     occasionally  . Drug Use: Yes    Special: Marijuana     occasional  . Sexually Active: None   Other Topics Concern  . None   Social History Narrative  . None   Family History  Problem Relation Age of Onset  . Cancer Maternal Aunt     ?  [name] Aneurysm Mother   . Stroke Mother   . Thyroid disease Mother   . Heart disease Mother   . Emphysema Maternal Grandmother        Review of Systems As above.    Objective:   Physical Exam General: WDWN NAD Eyes: anicteric Lungs: clear Heart: S1S2 no rubs, murmurs or gallops Abdomen: soft and nontender, BS+ Ext: no edema  Assessment & Plan:  Complicated situation of possible Crohn's ileitis. The colonoscopy findings suggested that. He has not improved with Entocort EC. He has other constitutional symptoms including sweats, he also has insomnia. I still think it is possible he has IBS and some nonspecific inflammation.  I am going to have him evaluated with a small bowel capsule endoscopy. I discussed the risks benefits and indications. We will hold his Entocort at this time.  I think he may respond to MiraLax for constipation, and that may help his rectal bleeding from hemorrhoids also. Explain to the patient and his wife we are still trying to sort out exactly what is going on.

## 2010-12-30 NOTE — Patient Instructions (Addendum)
Stop Entocort. Start Miralax 17 grams in 6-8 ounces of liquid daily, you can purchase this over the counter. You have been given samples today. You have been scheduled for a Capsule Endoscopy on 01/12/11 at 8:00 am.

## 2011-01-13 ENCOUNTER — Ambulatory Visit (INDEPENDENT_AMBULATORY_CARE_PROVIDER_SITE_OTHER): Payer: Medicaid Other | Admitting: Internal Medicine

## 2011-01-13 DIAGNOSIS — K5000 Crohn's disease of small intestine without complications: Secondary | ICD-10-CM

## 2011-01-13 DIAGNOSIS — K921 Melena: Secondary | ICD-10-CM

## 2011-01-13 HISTORY — PX: GIVENS CAPSULE STUDY: SHX5432

## 2011-01-13 NOTE — Progress Notes (Signed)
Patient here for Capsule Endoscopy for Dr. Leone Payor. Patient and significant other verbalized understanding of all verbal and written instructions. Patient has been NPO and did the prep required for procedure. Patient swallowed pill without difficulty. [id number].

## 2011-01-19 ENCOUNTER — Telehealth: Payer: Self-pay | Admitting: Internal Medicine

## 2011-01-19 ENCOUNTER — Telehealth: Payer: Self-pay

## 2011-01-19 NOTE — Telephone Encounter (Signed)
Patient advised.  REV scheduled for 02/05/11

## 2011-01-19 NOTE — Telephone Encounter (Signed)
Message copied by Annett Fabian on Mon Jan 19, 2011 10:45 AM ------      Message from: Stan Head E      Created: Mon Jan 19, 2011  7:23 AM      Regarding: capsule results       Capsule endo shows inflammation in small intestine      Needs REV in January to discuss results and possible treatment      Looks like Crohn's disease - he has denied using NSAID's

## 2011-01-19 NOTE — Telephone Encounter (Signed)
Reviewed the results of capsule with the patient's wife.  He will keep the appt for 02/05/11

## 2011-01-21 ENCOUNTER — Encounter: Payer: Self-pay | Admitting: Internal Medicine

## 2011-01-30 ENCOUNTER — Telehealth: Payer: Self-pay | Admitting: Internal Medicine

## 2011-01-30 NOTE — Telephone Encounter (Signed)
I advised pt his appt is thur 02-05-11 11:30am wit Dr Joya Gaskins

## 2011-02-05 ENCOUNTER — Ambulatory Visit (INDEPENDENT_AMBULATORY_CARE_PROVIDER_SITE_OTHER): Payer: Medicaid Other | Admitting: Internal Medicine

## 2011-02-05 ENCOUNTER — Encounter: Payer: Self-pay | Admitting: Internal Medicine

## 2011-02-05 VITALS — BP 110/70 | HR 76 | Ht 68.0 in | Wt 171.4 lb

## 2011-02-05 DIAGNOSIS — K648 Other hemorrhoids: Secondary | ICD-10-CM

## 2011-02-05 DIAGNOSIS — K5000 Crohn's disease of small intestine without complications: Secondary | ICD-10-CM

## 2011-02-05 DIAGNOSIS — K219 Gastro-esophageal reflux disease without esophagitis: Secondary | ICD-10-CM

## 2011-02-05 MED ORDER — PREDNISONE 10 MG PO TABS: 40.0000 mg | ORAL_TABLET | Freq: Every day | ORAL | Status: DC

## 2011-02-05 MED ORDER — OMEPRAZOLE 20 MG PO CPDR: 20.0000 mg | DELAYED_RELEASE_CAPSULE | Freq: Every day | ORAL | Status: DC

## 2011-02-05 MED ORDER — HYDROCORTISONE 2.5 % RE CREA: TOPICAL_CREAM | Freq: Two times a day (BID) | RECTAL | Status: AC

## 2011-02-05 NOTE — Assessment & Plan Note (Addendum)
Start prednisone 40 mg daily. Followup in 2 weeks to see how he is doing. I've asked him to call and we will try to call him as well. Would then start to taper if he has improvement. Unfortunately has multiple symptoms which may not always related to Crohn's disease. This may make it difficult to sort out response to therapy. I would anticipate, she has a good response to prednisone, probably start Pentasa. I have some concerns about his ability to understand the need for compliance with labs etc. and the risks of immunomodulators though he may need those to keep his disease under control depending upon the course. I every explaining the nature of Crohn's disease and has a chronic illness and provide educational handouts again. A gas and flatulence prevention dye was provided to try and minimize symptoms and he was advised to avoid milk products due to possible lack dose intolerance, he is already doing that. His wife was here and heard the explanations as well.

## 2011-02-05 NOTE — Progress Notes (Signed)
Subjective:    Patient ID: Gary Barton, male    DOB: Oct 13, 1970, 41 y.o.   MRN: [id number]  HPI This man returns for followup, working diagnosis is been Crohn's ileitis. Colonoscopy with ileoscopy showed ileal ulcers and erosions in chronic active ileitis on biopsy. A trial though incomplete, of Entocort did not seem to provide benefit. I had him do a capsule endoscopy of the small bowel which demonstrated aphthous-like ulcers in the distal small intestine. I have asked him and his wife again and he denies any salicylate or nonsteroidal use.  He continues to have crampy abdominal pain at times and stringy stools. There is tenesmus with gas production. His ability to provide a clearcut history is somewhat limited. He still has bright red blood with wiping at times and it sounds like he passes into the commode at times. He cannot keep the Canasa suppositories in, he says they push right out. He has a sense of rectal pressure much of the time.  He is also having regurgitation and reflux, mostly postprandial and of fluid with occasional nausea and vomiting. Not on File Outpatient Prescriptions Prior to Visit  Medication Sig Dispense Refill  . lidocaine (XYLOCAINE) 5 % ointment Apply topically as needed. To rectum  37.5 g  0  . dicyclomine (BENTYL) 20 MG tablet Take 1 tablet (20 mg total) by mouth every 6 (six) hours. Before meals and at bedtme  120 tablet  0  . mesalamine (CANASA) 1000 MG suppository Place 1 suppository (1,000 mg total) rectally at bedtime.  30 suppository  1  . budesonide (ENTOCORT EC) 3 MG 24 hr capsule Take 3 capsules (9 mg total) by mouth every morning. STOP UNTIL TOLD TO RESTART  90 capsule  1   Past Medical History  Diagnosis Date  . Anxiety and depression   . History of mixed drug abuse     marijuana - EtOH  . History of drug overdose     on Xanax  . Chronic headaches   . Nephrolithiasis   . Allergy     SINUS  . Anxiety   . Depression   . Arthritis     HAND    . Asthma     AS CHILD  . Neuromuscular disorder     NERVE/MUSCLE CUT AND REPLACED  . Substance abuse   . Internal hemorrhoids   . Gastritis   . Crohn's ileitis 2012   Past Surgical History  Procedure Date  . Right arm surgery   . Tonsillectomy   . Colonoscopy w/ biopsies 11/25/2010    internal hemorrhoids, Crohn's ileitis suspected  . Upper gastrointestinal endoscopy 11/25/2010    gastritis  . Givens capsule study 01/13/2011    Distal small bowel ulcers consistent with Crohn's ileitis   History   Social History  . Marital Status: Married    Spouse Name: N/A    Number of Children: 2  .     Occupational History  . unemployed    Social History Main Topics  . Smoking status: Former Smoker    Quit date: 01/26/2009  . Smokeless tobacco: Never Used  . Alcohol Use: Yes     occasionally  . Drug Use: Yes    Special: Marijuana     occasional  . Sexually Active: Not currently      Family History  Problem Relation Age of Onset  . Cancer Maternal Aunt     ?  [name] Aneurysm Mother   . Stroke Mother   .  Thyroid disease Mother   . Heart disease Mother   . Emphysema Maternal Grandmother   . Colon cancer Neg Hx        Review of Systems Scrotum is apparently swollen and tender at times. Positive for night sweats Positive for insomnia, anhedonia, decreased libido.     Objective:   Physical Exam General:  NAD, middle-aged black man Eyes:   anicteric Abdomen:  soft and nontender, BS+ GU:  Normal circumcised penis, scrotum and testes are normal inspection palpation. There are no hernias Ext:   no edema Lymph: No lymphadenopathy in the cervical supraclavicular axillary or inguinal regions.    Data Reviewed: I have reviewed the colonoscopy and capsule endoscopy findings with the patient and his wife again.         Assessment & Plan:   1. Crohn's ileitis   2. GERD (gastroesophageal reflux disease)   3. Hemorrhoids, internal, with bleeding      It does seem  like he has Crohn's disease at this point. I have explained that  can be a difficult diagnosis to make. Are not sure he really to the Entocort long enough. I discussed various treatment options included steroids and mesalamine compounds. Have not discussed immune modulators and biologic says his disease is not that severe at this point. I think he understands the idea of an idiopathic immune mediated inflammatory condition. I explained as best I could and gave him handouts again. I clearly explained that this is a chronic illness and requires chronic therapy. I also explained that I don't think all of his symptoms he is having currently are related to that. I recommended he see his primary care provider about his genitourinary complaints though I did not find any problems on exam today. PPI therapy to be started for reflux symptoms. Change from Canasa suppositories to hydrocortisone cream for the hemorrhoids.

## 2011-02-05 NOTE — Assessment & Plan Note (Signed)
He could not retain Canasa suppositories, try ProctoCream hydrocortisone 2.5%

## 2011-02-05 NOTE — Assessment & Plan Note (Signed)
EGD was normal but he has symptoms of this. Particularly with starting prednisone start omeprazole 20 mg daily.

## 2011-02-05 NOTE — Patient Instructions (Addendum)
Crohn's and Gas and Flatulence Prevention Diet handouts given to you to read and follow. Your prescription(s) has(have) been sent to your pharmacy for you to pick up (Proctocream. Prednisone, Omeprazole). Please give [place] a call back in 2 weeks to let us know how the prednisone is helping in case mediation needs adjusting. Return to see Dr. Leone Payor in 8 weeks.

## 2011-02-23 ENCOUNTER — Telehealth: Payer: Self-pay | Admitting: Gastroenterology

## 2011-02-23 NOTE — Telephone Encounter (Signed)
Patient informed. 

## 2011-02-23 NOTE — Telephone Encounter (Signed)
Reduce prednisone to 30 mg daily and call back in 1 week for another update

## 2011-02-23 NOTE — Telephone Encounter (Signed)
Talked to the patient to see how he was doing on the prednisone and other medications. Patient stated that he is no better, still having vomiting, pain, hard to swallow, having bleeding, having sweats and can't sleep, not eating much and the medication is giving him a twitch. Please advise.

## 2011-03-02 ENCOUNTER — Telehealth: Payer: Self-pay | Admitting: Gastroenterology

## 2011-03-02 DIAGNOSIS — K5090 Crohn's disease, unspecified, without complications: Secondary | ICD-10-CM

## 2011-03-02 NOTE — Telephone Encounter (Signed)
1) Reduce prednisone to 20 mg daily 2) CBC, C reactive protein (CRP), Sed rate - use Crohn's diagnosis and do these by Wednesday. Stool for WBC also.

## 2011-03-02 NOTE — Telephone Encounter (Signed)
Called the patient to follow up on Prednisone and condition. Patient stated that nothing has changed. He is still having diarrhea, bleeding, in pain, not sleeping, and twitching from the medication.

## 2011-03-02 NOTE — Telephone Encounter (Signed)
Message copied by Bernita Buffy on Mon Mar 02, 2011 10:46 AM ------      Message from: Gary Barton A      Created: Mon Feb 23, 2011  2:16 PM       Call the patient for an update on prednisone and condition.

## 2011-03-03 NOTE — Telephone Encounter (Signed)
Patient informed to reduce prednisone to 20 mg daily. And lab orders placed.

## 2011-03-04 ENCOUNTER — Other Ambulatory Visit (INDEPENDENT_AMBULATORY_CARE_PROVIDER_SITE_OTHER): Payer: Medicaid Other

## 2011-03-04 DIAGNOSIS — K5090 Crohn's disease, unspecified, without complications: Secondary | ICD-10-CM

## 2011-03-04 LAB — CBC WITH DIFFERENTIAL/PLATELET
Eosinophils Relative: 2 % (ref 0.0–5.0)
HCT: 36.8 % — ABNORMAL LOW (ref 39.0–52.0)
Hemoglobin: 12.4 g/dL — ABNORMAL LOW (ref 13.0–17.0)
Lymphocytes Relative: 34 % (ref 12.0–46.0)
Monocytes Relative: 5 % (ref 3.0–12.0)
RBC: 3.81 Mil/uL — ABNORMAL LOW (ref 4.22–5.81)
RDW: 15.4 % — ABNORMAL HIGH (ref 11.5–14.6)
WBC: 8.8 10*3/uL (ref 4.5–10.5)

## 2011-03-04 LAB — SEDIMENTATION RATE: Sed Rate: 6 mm/hr (ref 0–22)

## 2011-03-05 LAB — C-REACTIVE PROTEIN: CRP: 0.07 mg/dL (ref <0.60)

## 2011-03-05 NOTE — Progress Notes (Signed)
Quick Note:  Let him know labs look ok Waiting on the stool specimen still Have him set up an office visit in next week or week after ______

## 2011-03-06 ENCOUNTER — Other Ambulatory Visit: Payer: Self-pay | Admitting: Gastroenterology

## 2011-03-17 ENCOUNTER — Ambulatory Visit: Payer: Medicaid Other | Admitting: Internal Medicine

## 2011-03-31 ENCOUNTER — Other Ambulatory Visit: Payer: Self-pay

## 2011-03-31 DIAGNOSIS — K5090 Crohn's disease, unspecified, without complications: Secondary | ICD-10-CM

## 2011-04-13 ENCOUNTER — Encounter: Payer: Self-pay | Admitting: *Deleted

## 2011-04-14 ENCOUNTER — Ambulatory Visit: Payer: Self-pay | Admitting: Internal Medicine

## 2011-04-14 ENCOUNTER — Encounter: Payer: Self-pay | Admitting: Gastroenterology

## 2011-05-08 ENCOUNTER — Telehealth: Payer: Self-pay | Admitting: Gastroenterology

## 2011-05-08 ENCOUNTER — Emergency Department (HOSPITAL_COMMUNITY)
Admission: EM | Admit: 2011-05-08 | Discharge: 2011-05-08 | Disposition: A | Discharge status: Home / Self Care | Payer: Self-pay | Attending: Emergency Medicine | Admitting: Emergency Medicine

## 2011-05-08 ENCOUNTER — Encounter (HOSPITAL_COMMUNITY): Payer: Self-pay | Admitting: Emergency Medicine

## 2011-05-08 DIAGNOSIS — R82998 Other abnormal findings in urine: Secondary | ICD-10-CM | POA: Insufficient documentation

## 2011-05-08 DIAGNOSIS — J45909 Unspecified asthma, uncomplicated: Secondary | ICD-10-CM | POA: Insufficient documentation

## 2011-05-08 DIAGNOSIS — R825 Elevated urine levels of drugs, medicaments and biological substances: Secondary | ICD-10-CM

## 2011-05-08 DIAGNOSIS — F341 Dysthymic disorder: Secondary | ICD-10-CM | POA: Insufficient documentation

## 2011-05-08 LAB — RAPID URINE DRUG SCREEN, HOSP PERFORMED
Amphetamines: NOT DETECTED
Barbiturates: NOT DETECTED
Benzodiazepines: NOT DETECTED
Cocaine: POSITIVE — AB
Opiates: NOT DETECTED
Tetrahydrocannabinol: POSITIVE — AB

## 2011-05-08 NOTE — Discharge Instructions (Signed)
Please read and follow all provided instructions.  Your diagnoses today include:  1. Elevated urine levels of drugs, medicaments or biological substances     Tests performed today include:  Urine drug screen  Vital signs. See below for your results today.   Medications prescribed:   None  Home care instructions:  Follow any educational materials contained in this packet.  BE VERY CAREFUL not to take multiple medicines containing Tylenol (also called acetaminophen). Doing so can lead to an overdose which can damage your liver and cause liver failure and possibly death.   Follow-up instructions: Please follow-up with your primary care provider as needed. If you do not have a primary care doctor -- see below for referral information.   Call [facility] and ask for Medical Records Department if you need copies of your results today.   Return instructions:   Please return if you have any other emergent concerns.  Additional Information:  Your vital signs today were: BP 125/82  Pulse 73  Temp(Src) 99 F (37.2 C) (Oral)  Resp 18  SpO2 98% If your blood pressure (BP) was elevated above 135/85 this visit, please have this repeated by your doctor within one month. -------------- No Primary Care Doctor Call [facility]  [phone] Other agencies that provide inexpensive medical care    [facility]  [phone]    [facility]  [phone]    [facility]  [phone]    [facility]  [phone]    [facility]  [phone]    [facility]  [phone] -------------- RESOURCE GUIDE:  Dental Problems  Patients with Medicaid: [facility] [phone] [address]                                            [phone] [address] Phone:  [phone]                                                   Phone:  [phone]  If unable to pay or uninsured, contact:  [facility] or [facility] to become qualified for the adult dental clinic.  Chronic Pain Problems Contact [facility]  [phone] Patients need to be referred by their primary care doctor.  Insufficient Money for Medicine Contact [employer]:  call "211" or [facility] [phone].  Psychological Services [facility]  [phone] [facility]  [phone] [facility]   [phone] (emergency services [phone])  Substance Abuse Resources [facility]  [phone] [facility] [phone] [facility] [place] [phone] Floydene Flock [phone] Residential & Outpatient Substance Abuse Program  [phone]  Abuse/Neglect [facility] Child Abuse Hotline [phone] [facility] Child Abuse Hotline 7570126943 (After Hours)  Emergency Shelter [facility] [phone]  Maternity Homes [facility] [phone] [place] [phone]  [facility]  [facility] [address] [place]                       [address]      [address]  [place]                                                [place]                            [place] Phone:  [phone]                                   Phone:  [phone]                 Phone:  [phone]  [facility] Phone:  [phone]  [place] Child Abuse Hotline [phone]) 342-1394 [phone] (After Hours)    

## 2011-05-08 NOTE — ED Notes (Addendum)
Pt is requesting a UDS due to personal issues: pt informed that UDS is done for medical reasons and that ED provides medical screenings by a provider.

## 2011-05-08 NOTE — Telephone Encounter (Signed)
On call note. Pts wife calling at [phone] saying pt had a drug test showing cocaine. She would not say where it was done but it was not ordered by Dr. Leone Payor. Wants to know if a medication he is prescribed is causing the positive test. I advised that we are not experts in this area. I advised them to stop the lidocaine as it might show up like cocaine on a drug test. Will forward to Dr. Leone Payor for his review. May need a pharmacist or an MD skilled in this area to help with this.

## 2011-05-08 NOTE — ED Notes (Signed)
Pt states he needs to have a urine drug test done in regard for the child care.

## 2011-05-08 NOTE — ED Provider Notes (Signed)
History     CSN: [id number]  Arrival date & time 05/08/11  2015   First MD Initiated Contact with Patient 05/08/11 2154      Chief Complaint  Patient presents with  . urine drug test     (Consider location/radiation/quality/duration/timing/severity/associated sxs/prior treatment) HPI Comments: Patient presents requesting urine drug screen. States he needs to have this done for his lawyer. Denies other current medical complaints. He does have a history of Crohn's disease for which he uses lidocaine gel. No treatments prior to arrival.  The history is provided by the patient.    Past Medical History  Diagnosis Date  . Anxiety and depression   . History of mixed drug abuse     marijuana - EtOH  . History of drug overdose     on Xanax  . Chronic headaches   . Nephrolithiasis   . Allergy     SINUS  . Anxiety   . Depression   . Arthritis     HAND  . Asthma     AS CHILD  . Neuromuscular disorder     NERVE/MUSCLE CUT AND REPLACED  . Substance abuse   . Internal hemorrhoids   . Gastritis   . Crohn's ileitis 2012  . Colon ulcer, aphthous 2012    per capsule endoscopy    Past Surgical History  Procedure Date  . Right arm surgery   . Tonsillectomy   . Colonoscopy w/ biopsies 11/25/2010    internal hemorrhoids, Crohn's ileitis suspected  . Upper gastrointestinal endoscopy 11/25/2010    gastritis  . Givens capsule study 01/13/2011    Distal small bowel ulcers consistent with Crohn's ileitis    Family History  Problem Relation Age of Onset  . Cancer Maternal Aunt     ?  [name] Aneurysm Mother   . Stroke Mother   . Thyroid disease Mother   . Heart disease Mother   . Emphysema Maternal Grandmother   . Colon cancer Neg Hx     History  Substance Use Topics  . Smoking status: Former Smoker    Quit date: 01/26/2009  . Smokeless tobacco: Never Used  . Alcohol Use: Yes     occasionally      Review of Systems  Constitutional: Negative for fever.  HENT: Negative  for sore throat and rhinorrhea.   Cardiovascular: Negative for chest pain.  Gastrointestinal: Negative for nausea and vomiting.  Skin: Negative for rash.    Allergies  Review of patient's allergies indicates no known allergies.  Home Medications   Current Outpatient Rx  Name Route Sig Dispense Refill  . LIDOCAINE 5 % EX OINT Topical Apply topically as needed. To rectum 37.5 g 0  . OMEPRAZOLE 20 MG PO CPDR Oral Take 20 mg by mouth daily.    [name] PREDNISONE 10 MG PO TABS Oral Take 40 mg by mouth daily.    [name] OMEPRAZOLE 20 MG PO CPDR Oral Take 1 capsule (20 mg total) by mouth daily. 30 capsule 2    BP 125/82  Pulse 73  Temp(Src) 99 F (37.2 C) (Oral)  Resp 18  SpO2 98%  Physical Exam  Nursing note and vitals reviewed. Constitutional: He is oriented to person, place, and time. He appears well-developed and well-nourished.  HENT:  Head: Normocephalic and atraumatic.  Eyes: Conjunctivae are normal.  Neck: Normal range of motion. Neck supple.  Pulmonary/Chest: No respiratory distress.  Neurological: He is alert and oriented to person, place, and time.  Skin: Skin is warm  and dry.  Psychiatric: He has a normal mood and affect.    ED Course  Procedures (including critical care time)  Labs Reviewed  URINE RAPID DRUG SCREEN (HOSP PERFORMED) - Abnormal; Notable for the following:    Cocaine POSITIVE (*)    Tetrahydrocannabinol POSITIVE (*)    All other components within normal limits   No results found.   1. Elevated urine levels of drugs, medicaments or biological substances     10:25 PM Patient seen and examined. Patient informed of results. Instructed to contact medical records if he needs copies of his results tonight.  Vital signs reviewed and are as follows: Filed Vitals:   05/08/11 2043  BP: 125/82  Pulse: 73  Temp: 99 F (37.2 C)  Resp: 18    MDM  Patient requesting urine drug screen, performed, no other complaints.        [place], [place] 05/09/11  [phone]

## 2011-05-11 ENCOUNTER — Telehealth: Payer: Self-pay | Admitting: Internal Medicine

## 2011-05-11 NOTE — Telephone Encounter (Signed)
I can supply a letter stating he was on lidocaine jelly but do not have expert knowledge about its ability to cause false + test and am unable to state that.

## 2011-05-11 NOTE — Telephone Encounter (Signed)
Patient's wife is calling requesting a letter for his attorney (child custody) that he has crohn's disease and is using lidocaine topically for his hemorrhoids.  The wife is also asking for a letter stating that lidocaine could cause a false positive for cocaine.  They would like this letter asap.

## 2011-05-12 ENCOUNTER — Encounter: Payer: Self-pay | Admitting: Internal Medicine

## 2011-05-12 NOTE — Telephone Encounter (Signed)
Patient's wife is advised that the letter is at the front desk to pick up

## 2011-05-15 NOTE — ED Provider Notes (Signed)
Medical screening examination/treatment/procedure(s) were performed by non-physician practitioner and as supervising physician I was immediately available for consultation/collaboration.  Taylia Berber, MD 05/15/11 0927 

## 2011-05-24 ENCOUNTER — Telehealth: Payer: Self-pay | Admitting: Gastroenterology

## 2011-05-24 MED ORDER — PREDNISONE 10 MG PO TABS: 40.0000 mg | ORAL_TABLET | Freq: Every day | ORAL | Status: DC

## 2011-05-24 NOTE — Telephone Encounter (Signed)
Patient states that he has run out of meds and is having pain, bleeding and diarrhea.  He is requesting prednisone. i will prescribe pred 40mg  qd and told him the office will contact him to set up an OV this week

## 2011-05-25 NOTE — Telephone Encounter (Signed)
See below - had had missed appointments Needs follow-up

## 2011-05-25 NOTE — Telephone Encounter (Signed)
Patient is scheduled for tomorrow at 10:15.  Appt scheduled with the patient

## 2011-05-26 ENCOUNTER — Ambulatory Visit (INDEPENDENT_AMBULATORY_CARE_PROVIDER_SITE_OTHER): Payer: Medicaid Other | Admitting: Internal Medicine

## 2011-05-26 ENCOUNTER — Other Ambulatory Visit (INDEPENDENT_AMBULATORY_CARE_PROVIDER_SITE_OTHER): Payer: Medicaid Other

## 2011-05-26 ENCOUNTER — Encounter: Payer: Self-pay | Admitting: Internal Medicine

## 2011-05-26 VITALS — BP 126/82 | HR 90 | Temp 98.1°F | Ht 68.0 in | Wt 166.0 lb

## 2011-05-26 DIAGNOSIS — K5000 Crohn's disease of small intestine without complications: Secondary | ICD-10-CM

## 2011-05-26 DIAGNOSIS — Z79899 Other long term (current) drug therapy: Secondary | ICD-10-CM

## 2011-05-26 DIAGNOSIS — K625 Hemorrhage of anus and rectum: Secondary | ICD-10-CM

## 2011-05-26 LAB — COMPREHENSIVE METABOLIC PANEL
ALT: 24 U/L (ref 0–53)
AST: 23 U/L (ref 0–37)
Albumin: 4.4 g/dL (ref 3.5–5.2)
Alkaline Phosphatase: 67 U/L (ref 39–117)
BUN: 18 mg/dL (ref 6–23)
CO2: 30 mEq/L (ref 19–32)
Calcium: 9.7 mg/dL (ref 8.4–10.5)
Chloride: 105 mEq/L (ref 96–112)
Creatinine, Ser: 1 mg/dL (ref 0.4–1.5)
GFR: 103.73 mL/min (ref >60.00)
Glucose, Bld: 94 mg/dL (ref 70–99)
Potassium: 4.3 mEq/L (ref 3.5–5.1)
Sodium: 141 mEq/L (ref 135–145)
Total Bilirubin: 0.6 mg/dL (ref 0.3–1.2)
Total Protein: 7.3 g/dL (ref 6.0–8.3)

## 2011-05-26 LAB — SEDIMENTATION RATE: Sed Rate: 13 mm/hr (ref 0–22)

## 2011-05-26 LAB — CBC WITH DIFFERENTIAL/PLATELET
Basophils Absolute: 0 10*3/uL (ref 0.0–0.1)
Hemoglobin: 13.1 g/dL (ref 13.0–17.0)
Lymphocytes Relative: 11.9 % — ABNORMAL LOW (ref 12.0–46.0)
Monocytes Relative: 5.3 % (ref 3.0–12.0)
Neutrophils Relative %: 82 % — ABNORMAL HIGH (ref 43.0–77.0)
Platelets: 244 10*3/uL (ref 150.0–400.0)
RDW: 14.4 % (ref 11.5–14.6)

## 2011-05-26 MED ORDER — ADALIMUMAB 40 MG/0.8ML ~~LOC~~ KIT: PACK | SUBCUTANEOUS | Status: DC

## 2011-05-26 NOTE — Assessment & Plan Note (Signed)
Back on prednisone after non-compliance period,but has not had a great response overall. Hard to sort out all sxs. Plan for trying biologic therapy.

## 2011-05-26 NOTE — Patient Instructions (Addendum)
Your physician has requested that you go to the basement for lab work and a chest x-ray before leaving today.  We have sent a Humira prescription to your pharmacy.  Contact the pharmacy before picking it up to confirm that it is ready. Once you have the medication, call back [phone] to schedule injection teaching.  If you plan on a family member to inject this for you, they need to attend the teaching appointment.    Please return to the office Thursday of this week to have your TB test ready

## 2011-05-26 NOTE — Progress Notes (Signed)
  Subjective:    Patient ID: Gary Barton, male    DOB: [birth year], 41 y.o.   MRN: [id number]  HPI 41 yo African-american man here with his wife to follow-up for Crohn's ileitis. He has been on prednisone and not seen since Jan 2013. He had been started and had dose reduced to 20 mg daily due to side effects of agitation and twitching. He did not keep 2 follow-up appointments and stopped the prednisone. He had called asking for a letter to indicate that topical lidocaine could have caused a false + test for cocaine, as he tested + but denies use. This has caused denial of custody of a child he wishes to obtain that for. More recently he called back and complained of increasing diarrhea and rectal bleeding. Prednisone was restarted at 40 mg daily last week. He still complains of diffuse abdominal pain, intermittent nausea and vomiting as well as rectal bleeding. Last saw blood per rectum 1 week ago.  Denies fever or chills.  Medications, allergies, past medical history, past surgical history, family history and social history are reviewed and updated in the EMR.   Review of Systems Stopped his psych medications and is having some increased anxiety    Objective:   Physical Exam General:  NAD Eyes:   anicteric Lungs:  clear Heart:  S1S2 no rubs, murmurs or gallops Abdomen:  soft and mildly tender diffusely, BS+ Ext:   no edema Rectal: Mildly tender, brown and heme negative stool, no mass       Assessment & Plan:   1. Crohn's ileitis   2. Long-term use of immunosuppressant medication   3. Rectal bleeding     He has not responded to steroids well. Hard to sort out IBD from IBS symptoms. Likely does have some functional disturbances but has ileitis/small bowel ulcers on colonoscopy and capsule endoscopy consistent with crohn's. Denies NSAID's. At this point continue prednisone and make plans to start biologic therapy. PPD, CXR, Hep B studies, CRP, ESR ordered. I have reviewed risks  and benefits of biologic therapy and given [facility] handout for patient and wife to read.   YQ:MVHQION,GEXBMW TOM, MD

## 2011-05-27 ENCOUNTER — Other Ambulatory Visit: Payer: Self-pay

## 2011-05-27 ENCOUNTER — Telehealth: Payer: Self-pay

## 2011-05-27 DIAGNOSIS — K5090 Crohn's disease, unspecified, without complications: Secondary | ICD-10-CM

## 2011-05-27 LAB — HEPATITIS B SURFACE ANTIBODY,QUALITATIVE: Hep B S Ab: NEGATIVE

## 2011-05-27 LAB — HEPATITIS B CORE ANTIBODY, TOTAL: Hep B Core Total Ab: NEGATIVE

## 2011-05-27 MED ORDER — INFLIXIMAB 100 MG IV SOLR: INTRAVENOUS | Status: DC

## 2011-05-27 NOTE — Telephone Encounter (Signed)
Patient aware.  I have left him instructions to pick up when he comes to have his TB skin test read tomorrow

## 2011-05-27 NOTE — Telephone Encounter (Signed)
Patient is scheduled for the first Remicade for 06/08/11 11:00. I have left a message for the patient to call back to discuss

## 2011-05-27 NOTE — Telephone Encounter (Signed)
Message copied by Annett Fabian on Wed May 27, 2011  9:06 AM ------      Message from: Stan Head E      Created: Tue May 26, 2011  1:32 PM      Regarding: remicade vs. Humira       If this guy can get Remicade I would prefer - [facility] lady said they were on formulary             Please see if he could do Remicade as I think easier to handle that in his case             Thanks

## 2011-05-28 LAB — TB SKIN TEST
Induration: 0
TB Skin Test: NEGATIVE mm

## 2011-05-31 ENCOUNTER — Encounter (HOSPITAL_COMMUNITY): Payer: Self-pay | Admitting: Emergency Medicine

## 2011-05-31 ENCOUNTER — Emergency Department (HOSPITAL_COMMUNITY)
Admission: EM | Admit: 2011-05-31 | Discharge: 2011-06-01 | Disposition: A | Discharge status: Home / Self Care | Payer: Self-pay | Attending: Emergency Medicine | Admitting: Emergency Medicine

## 2011-05-31 DIAGNOSIS — K5090 Crohn's disease, unspecified, without complications: Secondary | ICD-10-CM | POA: Insufficient documentation

## 2011-05-31 MED ORDER — SODIUM CHLORIDE 0.9 % IV BOLUS (SEPSIS)
500.0000 mL | Freq: Once | INTRAVENOUS | Status: AC
  Administered 2011-06-01: 500 mL via INTRAVENOUS

## 2011-05-31 NOTE — ED Notes (Signed)
Pt c/o rectal bleeding, NVD that began 2-3 days ago. Pt has hx of crohns and states he has been dealing with abdominal pain "for years".  Pt also has hx of internal hemorrhoids.Pt states he notices the bleeding when using the restroom. Blood is in toilet as well as on toilet paper.

## 2011-05-31 NOTE — ED Notes (Signed)
Pt alert, c/o bright red rectal bleeding, hx of crohn's, hemorrhoids, onset was several days, worse today, resp even unlabored, skin pwd

## 2011-05-31 NOTE — ED Provider Notes (Signed)
History     CSN: [id number]  Arrival date & time 05/31/11  2158   First MD Initiated Contact with Patient 05/31/11 2332      Chief Complaint  Patient presents with  . Rectal Bleeding    hx of crohn's, abdominal pain    (Consider location/radiation/quality/duration/timing/severity/associated sxs/prior treatment) HPI Comments: Patient history of Crohn's disease who has been having bloody stools for the past 6 months now has increased abdominal pain despite the use of prednisone Dr. Lelon Frohlich is aware of this he is scheduled to start Remicade the end of this month presented to the emergency room tonight with nausea and vomiting as well as increased diffuse abdominal pain.  Patient is a 41 y.o. male presenting with hematochezia. The history is provided by the patient.  Rectal Bleeding  The current episode started more than 2 weeks ago. The problem occurs continuously. The problem has been gradually worsening. The pain is severe. The stool is described as bloody. Associated symptoms include abdominal pain and diarrhea. Pertinent negatives include no fever.    Past Medical History  Diagnosis Date  . Anxiety and depression   . History of mixed drug abuse     marijuana - EtOH  . History of drug overdose     on Xanax  . Chronic headaches   . Nephrolithiasis   . Allergy     SINUS  . Anxiety   . Depression   . Arthritis     HAND  . Asthma     AS CHILD  . Neuromuscular disorder     NERVE/MUSCLE CUT AND REPLACED  . Substance abuse   . Internal hemorrhoids   . Gastritis   . Crohn's ileitis 2012  . Colon ulcer, aphthous 2012    per capsule endoscopy    Past Surgical History  Procedure Date  . Right arm surgery   . Tonsillectomy   . Colonoscopy w/ biopsies 11/25/2010    internal hemorrhoids, Crohn's ileitis suspected  . Upper gastrointestinal endoscopy 11/25/2010    gastritis  . Givens capsule study 01/13/2011    Distal small bowel ulcers consistent with Crohn's ileitis     Family History  Problem Relation Age of Onset  . Cancer Maternal Aunt     ?  [name] Aneurysm Mother   . Stroke Mother   . Thyroid disease Mother   . Heart disease Mother   . Emphysema Maternal Grandmother   . Colon cancer Neg Hx     History  Substance Use Topics  . Smoking status: Former Smoker    Quit date: 01/26/2009  . Smokeless tobacco: Never Used  . Alcohol Use: Yes     occasionally      Review of Systems  Constitutional: Negative for fever and chills.  Respiratory: Negative for shortness of breath.   Gastrointestinal: Positive for abdominal pain, diarrhea, blood in stool and hematochezia.  Genitourinary: Positive for decreased urine volume. Negative for dysuria, urgency and frequency.  Neurological: Negative for dizziness.    Allergies  Review of patient's allergies indicates no known allergies.  Home Medications   Current Outpatient Rx  Name Route Sig Dispense Refill  . INFLIXIMAB 100 MG IV SOLR  Infuse 5 mg/kg at week 0,2,6 then q 8 weeks 1 each 0  . OMEPRAZOLE 20 MG PO CPDR Oral Take 20 mg by mouth daily.    [name] PREDNISONE 10 MG PO TABS Oral Take 4 tablets (40 mg total) by mouth daily. 120 tablet 1  . HYDROCODONE-ACETAMINOPHEN 5-325 MG  PO TABS Oral Take 1 tablet by mouth every 6 (six) hours as needed for pain. 10 tablet 0    BP 114/76  Pulse 109  Temp(Src) 98.8 F (37.1 C) (Oral)  Resp 16  Ht 5\' 8"  (1.727 m)  Wt 166 lb (75.297 kg)  BMI 25.24 kg/m2  SpO2 96%  Physical Exam  Constitutional: He appears well-developed.  HENT:  Head: Normocephalic.  Eyes: Pupils are equal, round, and reactive to light.  Neck: Normal range of motion.  Cardiovascular: Normal rate.   Pulmonary/Chest: Effort normal.  Abdominal: He exhibits no distension. There is tenderness. There is no guarding.  Musculoskeletal: Normal range of motion.  Neurological: He is alert.  Skin: Skin is warm and dry. No pallor.    ED Course  Procedures (including critical care  time)  Labs Reviewed  CBC - Abnormal; Notable for the following:    WBC 11.4 (*)    RBC 4.18 (*)    All other components within normal limits  DIFFERENTIAL - Abnormal; Notable for the following:    Monocytes Absolute 1.1 (*)    All other components within normal limits  BASIC METABOLIC PANEL   No results found.   1. Crohn's disease     Every few patient's labs his anemia status which are is stable for him he does have followup with Dr. Leone Payor. At this time I feel it is safe to send this patient home I will provide a prescription for pain control Despite the fact that the patient states having continuous diarrheal stools he has not no bowel movements while in the emergency department.  MDM  We'll evaluate for infection anemia        Arman Filter, NP 06/01/11 [phone]

## 2011-06-01 LAB — DIFFERENTIAL
Lymphs Abs: 2.8 10*3/uL (ref 0.7–4.0)
Monocytes Absolute: 1.1 10*3/uL — ABNORMAL HIGH (ref 0.1–1.0)
Monocytes Relative: 9 % (ref 3–12)
Neutro Abs: 7.5 10*3/uL (ref 1.7–7.7)
Neutrophils Relative %: 65 % (ref 43–77)

## 2011-06-01 LAB — CBC
HCT: 39.1 % (ref 39.0–52.0)
Hemoglobin: 13.4 g/dL (ref 13.0–17.0)
MCHC: 34.3 g/dL (ref 30.0–36.0)
RBC: 4.18 MIL/uL — ABNORMAL LOW (ref 4.22–5.81)

## 2011-06-01 LAB — BASIC METABOLIC PANEL
BUN: 18 mg/dL (ref 6–23)
CO2: 28 mEq/L (ref 19–32)
Chloride: 98 mEq/L (ref 96–112)
Creatinine, Ser: 1.03 mg/dL (ref 0.50–1.35)
Glucose, Bld: 96 mg/dL (ref 70–99)
Potassium: 4.2 mEq/L (ref 3.5–5.1)

## 2011-06-01 MED ORDER — ONDANSETRON HCL 4 MG/2ML IJ SOLN
4.0000 mg | Freq: Once | INTRAMUSCULAR | Status: AC
  Administered 2011-06-01: 4 mg via INTRAVENOUS
  Filled 2011-06-01: qty 2

## 2011-06-01 MED ORDER — HYDROCODONE-ACETAMINOPHEN 5-325 MG PO TABS: 1.0000 | ORAL_TABLET | Freq: Four times a day (QID) | ORAL | Status: AC | PRN

## 2011-06-01 MED ORDER — MORPHINE SULFATE 4 MG/ML IJ SOLN
6.0000 mg | Freq: Once | INTRAMUSCULAR | Status: AC
  Administered 2011-06-01: 6 mg via INTRAVENOUS
  Filled 2011-06-01: qty 2

## 2011-06-01 NOTE — ED Provider Notes (Signed)
Medical screening examination/treatment/procedure(s) were performed by non-physician practitioner and as supervising physician I was immediately available for consultation/collaboration.  Jahira Swiss, MD 06/01/11 0417 

## 2011-06-01 NOTE — Discharge Instructions (Signed)
Crohn's Disease Crohn's disease is a long-term (chronic) soreness and redness (inflammation) of the intestines (bowel). It can affect any portion of the digestive tract, from the mouth to the anus. It can also cause problems outside the digestive tract. Crohn's disease is closely related to a disease called ulcerative colitis (together, these two diseases are called inflammatory bowel disease).   CAUSES   The cause of Crohn's disease is not known. One theory is that, in an easily affected (susceptible) person, the immune system is triggered to attack the body's own digestive tissue. Crohn's disease runs in families. It seems to be more common in certain geographic areas and amongst certain races. There are no clear-cut dietary causes.   SYMPTOMS   Crohn's disease can cause many different symptoms since it can affect many different parts of the body. Symptoms include:  Fatigue.   Weight loss.   Chronic diarrhea, sometime bloody.   Abdominal pain and cramps.   Fever.   Ulcers or canker sores in the mouth or rectum.   Anemia (low red blood cells).   Arthritis, skin problems, and eye problems may occur.  Complications of Crohn's disease can include:  Series of holes (perforation) of the bowel.   Portions of the intestines sticking to each other (adhesions).   Obstruction of the bowel.   Fistula formation, typically in the rectal area but also in other areas. A fistula is an opening between the bowels and the outside, or between the bowels and another organ.   A painful crack in the mucous membrane of the anus (rectal fissure).  DIAGNOSIS   Your caregiver may suspect Crohn's disease based on your symptoms and an exam. Blood tests may confirm that there is a problem. You may be asked to submit a stool specimen for examination. X-rays and CT scans may be necessary. Ultimately, the diagnosis is usually made after a procedure that uses a flexible tube that is inserted via your mouth or your  anus. This is done under sedation and is called either an upper endoscopy or colonoscopy. With these tests, the specialist can take tiny tissue samples and remove them from the inside of the bowel (biopsy). Examination of this biopsy tissue under a microscope can reveal Crohn's disease as the cause of your symptoms. Due to the many different forms that Crohn's disease can take, symptoms may be present for several years before a diagnosis is made. HOME CARE INSTRUCTIONS    There is no cure for Crohn's disease. The best treatment is frequent checkups with your caregiver.   Symptoms such as diarrhea can be controlled with medications. Avoid foods that have a laxative effect such as fresh fruit, vegetables and dairy products. During flare ups, you can rest your bowel by refraining from solid foods. Drink clear liquids frequently during the day (electrolyte or re-hydrating fluids are best. Your caregiver can help you with suggestions). Drink often to prevent loss of body fluids (dehydration). When diarrhea has cleared, eat small meals and more frequently. Avoid food additives and stimulants such as caffeine (coffee, tea, or chocolate). Enzyme supplements may help if you develop intolerance to a sugar in dairy products (lactose). Ask your caregiver or dietitian about specific dietary instructions.   Try to maintain a positive attitude. Learn relaxation techniques such as self hypnosis, mental imaging, and muscle relaxation.   If possible, avoid stresses which can aggravate your condition.   Exercise regularly.   Follow your diet.   Always get plenty of rest.  SEEK MEDICAL   CARE IF:    Your symptoms fail to improve after a week or two of new treatment.   You experience continued weight loss.   You have ongoing crampy digestion or loose bowels.   You develop a new skin rash, skin sores, or eye problems.  SEEK IMMEDIATE MEDICAL CARE IF:    You have worsening of your symptoms or develop new  symptoms.   You have a fever.   You develop bloody diarrhea.   You develop severe abdominal pain.  MAKE SURE YOU:    Understand these instructions.   Will watch your condition.   Will get help right away if you are not doing well or get worse.  Document Released: 10/22/2004 Document Revised: 01/01/2011 Document Reviewed: 09/20/2006 [employer] Patient Information 2012 [employer]. 

## 2011-06-02 NOTE — Progress Notes (Signed)
Quick Note:  PPD negative To start remicade 5/13  Needs REV after he has 2 week Remicade dose Reduce prednisone to 3 tablets (30 mg) daily now ______

## 2011-06-03 ENCOUNTER — Other Ambulatory Visit: Payer: Self-pay

## 2011-06-03 MED ORDER — PREDNISONE 10 MG PO TABS: 30.0000 mg | ORAL_TABLET | Freq: Every day | ORAL | Status: DC

## 2011-06-08 ENCOUNTER — Encounter (HOSPITAL_COMMUNITY)
Admission: RE | Admit: 2011-06-08 | Discharge: 2011-06-08 | Disposition: A | Discharge status: Home / Self Care | Payer: Medicaid Other | Source: Ambulatory Visit | Attending: Internal Medicine | Admitting: Internal Medicine

## 2011-06-08 VITALS — BP 124/70 | HR 62 | Temp 98.3°F | Resp 16 | Ht 68.0 in | Wt 170.1 lb

## 2011-06-08 DIAGNOSIS — K5090 Crohn's disease, unspecified, without complications: Secondary | ICD-10-CM | POA: Insufficient documentation

## 2011-06-08 MED ORDER — SODIUM CHLORIDE 0.9 % IV SOLN
INTRAVENOUS | Status: AC
  Administered 2011-06-08: 250 mL via INTRAVENOUS

## 2011-06-08 MED ORDER — DIPHENHYDRAMINE HCL 25 MG PO CAPS
50.0000 mg | ORAL_CAPSULE | ORAL | Status: DC
  Administered 2011-06-08: 50 mg via ORAL

## 2011-06-08 MED ORDER — SODIUM CHLORIDE 0.9 % IV SOLN
5.0000 mg/kg | INTRAVENOUS | Status: DC
  Administered 2011-06-08: 400 mg via INTRAVENOUS
  Filled 2011-06-08: qty 40

## 2011-06-08 MED ORDER — ACETAMINOPHEN 325 MG PO TABS
ORAL_TABLET | ORAL | Status: AC
  Administered 2011-06-08: 650 mg
  Filled 2011-06-08: qty 2

## 2011-06-08 MED ORDER — DIPHENHYDRAMINE HCL 25 MG PO CAPS
ORAL_CAPSULE | ORAL | Status: AC
  Administered 2011-06-08: 50 mg via ORAL
  Filled 2011-06-08: qty 2

## 2011-06-08 MED ORDER — ACETAMINOPHEN 325 MG PO TABS: 650.0000 mg | ORAL_TABLET | ORAL | Status: DC

## 2011-06-08 MED ORDER — ACETAMINOPHEN ER 650 MG PO TBCR: 650.0000 mg | EXTENDED_RELEASE_TABLET | ORAL | Status: DC

## 2011-06-08 NOTE — Discharge Instructions (Signed)
Infliximab injection What is this medicine? INFLIXIMAB (in FLIX i mab) is used to treat Crohn's disease and ulcerative colitis. It is also used to treat ankylosing spondylitis, psoriasis, and some forms of arthritis. This medicine may be used for other purposes; ask your health care provider or pharmacist if you have questions. What should I tell my health care provider before I take this medicine? They need to know if you have any of these conditions: -diabetes -exposure to tuberculosis -heart failure -hepatitis or liver disease -immune system problems -infection -lung or breathing disease, like COPD -multiple sclerosis -current or past resident of [place] -seizure disorder -an unusual or allergic reaction to infliximab, mouse proteins, other medicines, foods, dyes, or preservatives -pregnant or trying to get pregnant -breast-feeding How should I use this medicine? This medicine is for injection into a vein. It is usually given by a health care professional in a hospital or clinic setting. A special MedGuide will be given to you by the pharmacist with each prescription and refill. Be sure to read this information carefully each time. Talk to your pediatrician regarding the use of this medicine in children. Special care may be needed. Overdosage: If you think you have taken too much of this medicine contact a poison control center or emergency room at once. NOTE: This medicine is only for you. Do not share this medicine with others. What if I miss a dose? It is important not to miss your dose. Call your doctor or health care professional if you are unable to keep an appointment. What may interact with this medicine? Do not take this medicine with any of the following medications: -anakinra -rilonacept This medicine may also interact with the following medications: -vaccines This list may not describe all possible interactions. Give your health care provider  a list of all the medicines, herbs, non-prescription drugs, or dietary supplements you use. Also tell them if you smoke, drink alcohol, or use illegal drugs. Some items may interact with your medicine. What should I watch for while using this medicine? Visit your doctor or health care professional for regular checks on your progress. If you get a cold or other infection while receiving this medicine, call your doctor or health care professional. Do not treat yourself. This medicine may decrease your body's ability to fight infections. Before beginning therapy, your doctor may do a test to see if you have been exposed to tuberculosis. This medicine may make the symptoms of heart failure worse in some patients. If you notice symptoms such as increased shortness of breath or swelling of the ankles or legs, contact your health care provider right away. If you are going to have surgery or dental work, tell your health care professional or dentist that you have received this medicine. If you take this medicine for plaque psoriasis, stay out of the sun. If you cannot avoid being in the sun, wear protective clothing and use sunscreen. Do not use sun lamps or tanning beds/booths. What side effects may I notice from receiving this medicine? Side effects that you should report to your doctor or health care professional as soon as possible: -allergic reactions like skin rash, itching or hives, swelling of the face, lips, or tongue -chest pain -fever or chills, usually related to the infusion -muscle or joint pain -red, scaly patches or raised bumps on the skin -signs of infection - fever or chills, cough, sore throat, pain or difficulty passing urine -swollen lymph nodes in the neck, underarm,   or groin areas -unexplained weight loss -unusual bleeding or bruising -unusually weak or tired -yellowing of the eyes or skin Side effects that usually do not require medical attention (report to your doctor or health  care professional if they continue or are bothersome): -headache -heartburn or stomach pain -nausea, vomiting This list may not describe all possible side effects. Call your doctor for medical advice about side effects. You may report side effects to FDA at 1-800-FDA-1088. Where should I keep my medicine? This drug is given in a hospital or clinic and will not be stored at home. NOTE: This sheet is a summary. It may not cover all possible information. If you have questions about this medicine, talk to your doctor, pharmacist, or health care provider.  2012, [employer]. (08/31/2007 10:26:02 AM) 

## 2011-06-22 ENCOUNTER — Encounter (HOSPITAL_COMMUNITY): Payer: Medicaid Other

## 2011-06-22 ENCOUNTER — Emergency Department (HOSPITAL_COMMUNITY)
Admission: EM | Admit: 2011-06-22 | Discharge: 2011-06-23 | Disposition: A | Discharge status: Home / Self Care | Payer: Self-pay | Attending: Emergency Medicine | Admitting: Emergency Medicine

## 2011-06-22 ENCOUNTER — Encounter (HOSPITAL_COMMUNITY): Payer: Self-pay | Admitting: Emergency Medicine

## 2011-06-22 DIAGNOSIS — K649 Unspecified hemorrhoids: Secondary | ICD-10-CM | POA: Insufficient documentation

## 2011-06-22 DIAGNOSIS — K6289 Other specified diseases of anus and rectum: Secondary | ICD-10-CM | POA: Insufficient documentation

## 2011-06-22 MED ORDER — HYDROCORTISONE 2.5 % RE CREA
TOPICAL_CREAM | Freq: Once | RECTAL | Status: AC
  Administered 2011-06-22: 23:00:00 via RECTAL
  Filled 2011-06-22: qty 28.35

## 2011-06-22 MED ORDER — LIDOCAINE HCL 2 % EX GEL
Freq: Once | CUTANEOUS | Status: AC
  Administered 2011-06-22: 20 via TOPICAL
  Filled 2011-06-22: qty 20

## 2011-06-22 MED ORDER — HYDROCORTISONE ACETATE 25 MG RE SUPP
25.0000 mg | Freq: Once | RECTAL | Status: AC
  Administered 2011-06-22: 25 mg via RECTAL
  Filled 2011-06-22: qty 1

## 2011-06-22 NOTE — ED Notes (Signed)
Patient was diagnosed with hemorrhoids around 5 months ago -- was given lidocaine cream for pain.  Patient reports that the hemorrhoids popped out two days ago and reports rectal bleeding.  Patient reports constipation (four days since last bowel movement), abdominal pain, and bleeding hemorrhoids.  Patient has history of Crohn's disease.

## 2011-06-23 ENCOUNTER — Encounter (HOSPITAL_COMMUNITY): Payer: Self-pay

## 2011-06-23 ENCOUNTER — Encounter (HOSPITAL_COMMUNITY)
Admission: RE | Admit: 2011-06-23 | Discharge: 2011-06-23 | Disposition: A | Discharge status: Home / Self Care | Payer: Medicaid Other | Source: Ambulatory Visit | Attending: Internal Medicine | Admitting: Internal Medicine

## 2011-06-23 VITALS — BP 123/78 | HR 72 | Temp 98.0°F | Resp 16 | Wt 176.2 lb

## 2011-06-23 DIAGNOSIS — K5090 Crohn's disease, unspecified, without complications: Secondary | ICD-10-CM

## 2011-06-23 MED ORDER — SODIUM CHLORIDE 0.9 % IV SOLN
INTRAVENOUS | Status: AC
  Administered 2011-06-23: 12:00:00 via INTRAVENOUS

## 2011-06-23 MED ORDER — HYDROCORTISONE ACETATE 25 MG RE SUPP: 25.0000 mg | Freq: Two times a day (BID) | RECTAL | Status: AC

## 2011-06-23 MED ORDER — ACETAMINOPHEN 325 MG PO TABS
ORAL_TABLET | ORAL | Status: AC
  Administered 2011-06-23: 650 mg via ORAL
  Filled 2011-06-23: qty 2

## 2011-06-23 MED ORDER — DOCUSATE SODIUM 100 MG PO CAPS: 100.0000 mg | ORAL_CAPSULE | Freq: Two times a day (BID) | ORAL | Status: AC

## 2011-06-23 MED ORDER — ACETAMINOPHEN 325 MG PO TABS
650.0000 mg | ORAL_TABLET | ORAL | Status: AC
  Administered 2011-06-23: 650 mg via ORAL

## 2011-06-23 MED ORDER — DIPHENHYDRAMINE HCL 25 MG PO CAPS
ORAL_CAPSULE | ORAL | Status: AC
  Administered 2011-06-23: 50 mg via ORAL
  Filled 2011-06-23: qty 2

## 2011-06-23 MED ORDER — SODIUM CHLORIDE 0.9 % IV SOLN
5.0000 mg/kg | INTRAVENOUS | Status: DC
  Administered 2011-06-23: 400 mg via INTRAVENOUS
  Filled 2011-06-23: qty 40

## 2011-06-23 MED ORDER — DIPHENHYDRAMINE HCL 25 MG PO CAPS
50.0000 mg | ORAL_CAPSULE | ORAL | Status: DC
  Administered 2011-06-23: 50 mg via ORAL

## 2011-06-23 NOTE — Progress Notes (Signed)
Pt arrives for week #2 of remicade. Was in ER for hemorrhoids. Pt  States he will be referred to surgeon for hemorrhoids. Called Dr Marvell Fuller office to ask if RN should go ahead with remicade treatment today.

## 2011-06-23 NOTE — Progress Notes (Signed)
Received call back from Alesia Banda at Dr Teresita Madura. To go ahead with remicade week 2 today.

## 2011-06-23 NOTE — Discharge Instructions (Signed)
Crohn's Disease Crohn's disease is a long-term (chronic) soreness and redness (inflammation) of the intestines (bowel). It can affect any portion of the digestive tract, from the mouth to the anus. It can also cause problems outside the digestive tract. Crohn's disease is closely related to a disease called ulcerative colitis (together, these two diseases are called inflammatory bowel disease).   CAUSES   The cause of Crohn's disease is not known. One theory is that, in an easily affected (susceptible) person, the immune system is triggered to attack the body's own digestive tissue. Crohn's disease runs in families. It seems to be more common in certain geographic areas and amongst certain races. There are no clear-cut dietary causes.   SYMPTOMS   Crohn's disease can cause many different symptoms since it can affect many different parts of the body. Symptoms include:  Fatigue.   Weight loss.   Chronic diarrhea, sometime bloody.   Abdominal pain and cramps.   Fever.   Ulcers or canker sores in the mouth or rectum.   Anemia (low red blood cells).   Arthritis, skin problems, and eye problems may occur.  Complications of Crohn's disease can include:  Series of holes (perforation) of the bowel.   Portions of the intestines sticking to each other (adhesions).   Obstruction of the bowel.   Fistula formation, typically in the rectal area but also in other areas. A fistula is an opening between the bowels and the outside, or between the bowels and another organ.   A painful crack in the mucous membrane of the anus (rectal fissure).  DIAGNOSIS   Your caregiver may suspect Crohn's disease based on your symptoms and an exam. Blood tests may confirm that there is a problem. You may be asked to submit a stool specimen for examination. X-rays and CT scans may be necessary. Ultimately, the diagnosis is usually made after a procedure that uses a flexible tube that is inserted via your mouth or your  anus. This is done under sedation and is called either an upper endoscopy or colonoscopy. With these tests, the specialist can take tiny tissue samples and remove them from the inside of the bowel (biopsy). Examination of this biopsy tissue under a microscope can reveal Crohn's disease as the cause of your symptoms. Due to the many different forms that Crohn's disease can take, symptoms may be present for several years before a diagnosis is made. HOME CARE INSTRUCTIONS    There is no cure for Crohn's disease. The best treatment is frequent checkups with your caregiver.   Symptoms such as diarrhea can be controlled with medications. Avoid foods that have a laxative effect such as fresh fruit, vegetables and dairy products. During flare ups, you can rest your bowel by refraining from solid foods. Drink clear liquids frequently during the day (electrolyte or re-hydrating fluids are best. Your caregiver can help you with suggestions). Drink often to prevent loss of body fluids (dehydration). When diarrhea has cleared, eat small meals and more frequently. Avoid food additives and stimulants such as caffeine (coffee, tea, or chocolate). Enzyme supplements may help if you develop intolerance to a sugar in dairy products (lactose). Ask your caregiver or dietitian about specific dietary instructions.   Try to maintain a positive attitude. Learn relaxation techniques such as self hypnosis, mental imaging, and muscle relaxation.   If possible, avoid stresses which can aggravate your condition.   Exercise regularly.   Follow your diet.   Always get plenty of rest.  SEEK MEDICAL   CARE IF:    Your symptoms fail to improve after a week or two of new treatment.   You experience continued weight loss.   You have ongoing crampy digestion or loose bowels.   You develop a new skin rash, skin sores, or eye problems.  SEEK IMMEDIATE MEDICAL CARE IF:    You have worsening of your symptoms or develop new  symptoms.   You have a fever.   You develop bloody diarrhea.   You develop severe abdominal pain.  MAKE SURE YOU:    Understand these instructions.   Will watch your condition.   Will get help right away if you are not doing well or get worse.  Document Released: 10/22/2004 Document Revised: 01/01/2011 Document Reviewed: 09/20/2006 [employer] Patient Information 2012 [employer]. 

## 2011-06-23 NOTE — ED Notes (Signed)
Pt denies any questions upon discharge. 

## 2011-06-23 NOTE — ED Provider Notes (Signed)
Medical screening examination/treatment/procedure(s) were performed by non-physician practitioner and as supervising physician I was immediately available for consultation/collaboration.  Doug Sou, MD 06/23/11 [phone]

## 2011-06-23 NOTE — Discharge Instructions (Signed)
Hemorrhoids Hemorrhoids are enlarged (dilated) veins around the rectum. There are 2 types of hemorrhoids, and the type of hemorrhoid is determined by its location. Internal hemorrhoids occur in the veins just inside the rectum.They are usually not painful, but they may bleed.However, they may poke through to the outside and become irritated and painful. External hemorrhoids involve the veins outside the anus and can be felt as a painful swelling or hard lump near the anus.They are often itchy and may crack and bleed. Sometimes clots will form in the veins. This makes them swollen and painful. These are called thrombosed hemorrhoids. CAUSES Causes of hemorrhoids include:  Pregnancy. This increases the pressure in the hemorrhoidal veins.   Constipation.   Straining to have a bowel movement.   Obesity.   Heavy lifting or other activity that caused you to strain.  TREATMENT Most of the time hemorrhoids improve in 1 to 2 weeks. However, if symptoms do not seem to be getting better or if you have a lot of rectal bleeding, your caregiver may perform a procedure to help make the hemorrhoids get smaller or remove them completely.Possible treatments include:  Rubber band ligation. A rubber band is placed at the base of the hemorrhoid to cut off the circulation.   Sclerotherapy. A chemical is injected to shrink the hemorrhoid.   Infrared light therapy. Tools are used to burn the hemorrhoid.   Hemorrhoidectomy. This is surgical removal of the hemorrhoid.  HOME CARE INSTRUCTIONS   Increase fiber in your diet. Ask your caregiver about using fiber supplements.   Drink enough water and fluids to keep your urine clear or pale yellow.   Exercise regularly.   Go to the bathroom when you have the urge to have a bowel movement. Do not wait.   Avoid straining to have bowel movements.   Keep the anal area dry and clean.   Only take over-the-counter or prescription medicines for pain, discomfort,  or fever as directed by your caregiver.  If your hemorrhoids are thrombosed:  Take warm sitz baths for 20 to 30 minutes, 3 to 4 times per day.   If the hemorrhoids are very tender and swollen, place ice packs on the area as tolerated. Using ice packs between sitz baths may be helpful. Fill a plastic bag with ice. Place a towel between the bag of ice and your skin.   Medicated creams and suppositories may be used or applied as directed.   Do not use a donut-shaped pillow or sit on the toilet for long periods. This increases blood pooling and pain.  SEEK MEDICAL CARE IF:   You have increasing pain and swelling that is not controlled with your medicine.   You have uncontrolled bleeding.   You have difficulty or you are unable to have a bowel movement.   You have pain or inflammation outside the area of the hemorrhoids.   You have chills or an oral temperature above 102 F (38.9 C).  MAKE SURE YOU:   Understand these instructions.   Will watch your condition.   Will get help right away if you are not doing well or get worse.  Document Released: 01/10/2000 Document Revised: 01/01/2011 Document Reviewed: 05/17/2007 [facility] Patient Information 2012 [place], [place]. You have been given rectal suppositories, as well as topical ointment to shrink your hemorrhoids, as well as a protocol Colace to make your stools soft in many take 1 tablet twice a day until your stool is easily passed then back off to one  tablet daily.  Please call [facility] to make an appointment for evaluation for hemorrhoidectomy

## 2011-06-23 NOTE — ED Provider Notes (Signed)
History     CSN: [id number]  Arrival date & time 06/22/11  2145   First MD Initiated Contact with Patient 06/22/11 2322      Chief Complaint  Patient presents with  . Hemorrhoids    (Consider location/radiation/quality/duration/timing/severity/associated sxs/prior treatment) HPI Comments: Patient with known hemorrhoids seen by his primary care physician.  Given.  Topical lidocaine without relief  The history is provided by the patient.    Past Medical History  Diagnosis Date  . Anxiety and depression   . History of mixed drug abuse     marijuana - EtOH  . History of drug overdose     on Xanax  . Chronic headaches   . Nephrolithiasis   . Allergy     SINUS  . Anxiety   . Depression   . Arthritis     HAND  . Asthma     AS CHILD  . Neuromuscular disorder     NERVE/MUSCLE CUT AND REPLACED  . Substance abuse   . Internal hemorrhoids   . Gastritis   . Crohn's ileitis 2012  . Colon ulcer, aphthous 2012    per capsule endoscopy    Past Surgical History  Procedure Date  . Right arm surgery   . Tonsillectomy   . Colonoscopy w/ biopsies 11/25/2010    internal hemorrhoids, Crohn's ileitis suspected  . Upper gastrointestinal endoscopy 11/25/2010    gastritis  . Givens capsule study 01/13/2011    Distal small bowel ulcers consistent with Crohn's ileitis    Family History  Problem Relation Age of Onset  . Cancer Maternal Aunt     ?  [name] Aneurysm Mother   . Stroke Mother   . Thyroid disease Mother   . Heart disease Mother   . Emphysema Maternal Grandmother   . Colon cancer Neg Hx     History  Substance Use Topics  . Smoking status: Former Smoker    Quit date: 01/26/2009  . Smokeless tobacco: Never Used  . Alcohol Use: Yes     occasionally      Review of Systems  Constitutional: Negative for fever.  Gastrointestinal: Positive for rectal pain. Negative for abdominal pain, blood in stool and anal bleeding.    Allergies  Review of patient's allergies  indicates no known allergies.  Home Medications   Current Outpatient Rx  Name Route Sig Dispense Refill  . INFLIXIMAB 100 MG IV SOLR  Infuse 5 mg/kg at week 0,2,6 then q 8 weeks 1 each 0  . OMEPRAZOLE 20 MG PO CPDR Oral Take 20 mg by mouth daily.    [name] PREDNISONE 10 MG PO TABS Oral Take 3 tablets (30 mg total) by mouth daily. 120 tablet 1    BP 119/82  Pulse 82  Temp(Src) 97.9 F (36.6 C) (Oral)  Resp 20  SpO2 95%  Physical Exam  Constitutional: He appears well-developed and well-nourished.  HENT:  Head: Normocephalic.  Neck: Normal range of motion.  Cardiovascular: Normal rate.   Pulmonary/Chest: Effort normal.  Abdominal: He exhibits no distension.       Large inflamed hemorrhoids that are not thrombosed  Musculoskeletal: Normal range of motion.  Neurological: He is alert.  Skin: Skin is warm.    ED Course  Procedures (including critical care time)  Labs Reviewed - No data to display No results found.   1. Hemorrhoids       MDM   Is with a large, painful.  External hemorrhoids  Arman Filter, NP 06/23/11 0117  Arman Filter, NP 06/23/11 [phone]

## 2011-06-25 ENCOUNTER — Ambulatory Visit: Payer: Medicaid Other | Admitting: Internal Medicine

## 2011-06-25 ENCOUNTER — Ambulatory Visit (INDEPENDENT_AMBULATORY_CARE_PROVIDER_SITE_OTHER)
Admission: RE | Admit: 2011-06-25 | Discharge: 2011-06-25 | Disposition: A | Discharge status: Home / Self Care | Payer: Self-pay | Source: Ambulatory Visit | Attending: Internal Medicine | Admitting: Internal Medicine

## 2011-06-25 ENCOUNTER — Encounter: Payer: Self-pay | Admitting: Internal Medicine

## 2011-06-25 ENCOUNTER — Ambulatory Visit (INDEPENDENT_AMBULATORY_CARE_PROVIDER_SITE_OTHER): Payer: Self-pay | Admitting: Internal Medicine

## 2011-06-25 VITALS — BP 100/76 | HR 76 | Ht 68.0 in | Wt 166.2 lb

## 2011-06-25 DIAGNOSIS — R05 Cough: Secondary | ICD-10-CM

## 2011-06-25 DIAGNOSIS — K648 Other hemorrhoids: Secondary | ICD-10-CM

## 2011-06-25 DIAGNOSIS — K5900 Constipation, unspecified: Secondary | ICD-10-CM

## 2011-06-25 DIAGNOSIS — K5000 Crohn's disease of small intestine without complications: Secondary | ICD-10-CM

## 2011-06-25 DIAGNOSIS — Z598 Other problems related to housing and economic circumstances: Secondary | ICD-10-CM

## 2011-06-25 DIAGNOSIS — R059 Cough, unspecified: Secondary | ICD-10-CM

## 2011-06-25 MED ORDER — LIDOCAINE (ANORECTAL) 5 % EX CREA: TOPICAL_CREAM | CUTANEOUS | Status: DC

## 2011-06-25 MED ORDER — PREDNISONE 10 MG PO TABS: 20.0000 mg | ORAL_TABLET | Freq: Every day | ORAL | Status: DC

## 2011-06-25 MED ORDER — POLYETHYLENE GLYCOL 3350 17 GM/SCOOP PO POWD: 17.0000 g | Freq: Every day | ORAL | Status: AC

## 2011-06-25 NOTE — Assessment & Plan Note (Addendum)
Off remicade after 2 doses due to loss of insurance. Remains on prednisone 30 mg daily at this point. He seems to be tolerating that okay and my senses he is actually somewhat better. We'll taper the prednisone over time, having and go to 20 mg daily starting tomorrow, and then to the 10 mg in 3 weeks and 5 mg 3 weeks after that and stopping in about 3-4 weeks. He is to see me in followup in about 2 months. Have suggested that he see if he cannot get some indigent care provisions set up. He will look into that. His wife is aware and understands the plan as well. It will be difficult to treat him if he cannot afford biologic therapy.

## 2011-06-25 NOTE — Assessment & Plan Note (Signed)
Continue hydrocortisone suppositories and sitz baths. Continue lidocaine topically, sample of 5% cream provided. He is to take MiraLax on a daily basis to try to relieve constipation.

## 2011-06-25 NOTE — Progress Notes (Signed)
  Subjective:    Patient ID: Gary Barton, male    DOB: [birth year], 41 y.o.   MRN: [id number]  HPI This 41 year old [place] American man is here with his wife followup Crohn's disease and hemorrhoids. He had taken Remicade on 2 infusion, but subsequently lost his Medicaid because he does not have custody of his child. Thus he is without insurance at this time. 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  He reports swollen painful and bleeding hemorrhoids. He was seen in the emergency department just in the last few days, is using lidocaine cream, and hydrocortisone suppositories. There are improved but still painful. He has become constipated has not moved his bowels in several days, and despite using a stool softener.  He also reports having copious rhinorrhea on the days after Remicade infusion and a chronic dry nocturnal cough since charting Remicade. He does not report fevers. There have been no signs or symptoms of allergic reactions.  Medications, allergies, past medical history, past surgical history, family history and social history are reviewed and updated in the EMR.    Review of Systems As above. Also positive for continuous fatigued problems.    Objective:   Physical Exam Thin well-developed no acute distress Skin with multiple tattoos no rash Eyes anicteric Pharynx is clear Lungs are clear Heart S1-S2 no rubs murmurs or gallop Neck is supple without thyromegaly masses or lymphadenopathy The abdomen is thin soft and nontender Inspection of the rectal area shows no perianal abscess or problems. There is a small swollen pile in the anal canal, there is no evidence of bleeding. Perianal area is mildly tender. No fluctuance or mass. Extremities free of edema       Assessment & Plan:

## 2011-06-25 NOTE — Patient Instructions (Signed)
Please go down to the basement today and get a chest x-ray.    Try Miralax OTC take 1 dose a day to help with the constipation.   Today you have been given recticare cream sample.  This is OTC product.  Take your Prednisone as directed above.

## 2011-06-25 NOTE — Assessment & Plan Note (Signed)
Dry cough for a few weeks, he says this began after he started Remicade. I think it highly unlikely that this is related to the Remicade but we'll do a chest x-ray.

## 2011-06-26 ENCOUNTER — Telehealth: Payer: Self-pay | Admitting: Internal Medicine

## 2011-06-26 ENCOUNTER — Emergency Department (HOSPITAL_COMMUNITY)
Admission: EM | Admit: 2011-06-26 | Discharge: 2011-06-26 | Disposition: A | Discharge status: Home / Self Care | Payer: Self-pay | Attending: Emergency Medicine | Admitting: Emergency Medicine

## 2011-06-26 ENCOUNTER — Encounter (HOSPITAL_COMMUNITY): Payer: Self-pay | Admitting: Emergency Medicine

## 2011-06-26 DIAGNOSIS — F3289 Other specified depressive episodes: Secondary | ICD-10-CM | POA: Insufficient documentation

## 2011-06-26 DIAGNOSIS — F411 Generalized anxiety disorder: Secondary | ICD-10-CM | POA: Insufficient documentation

## 2011-06-26 DIAGNOSIS — F329 Major depressive disorder, single episode, unspecified: Secondary | ICD-10-CM | POA: Insufficient documentation

## 2011-06-26 DIAGNOSIS — K644 Residual hemorrhoidal skin tags: Secondary | ICD-10-CM | POA: Insufficient documentation

## 2011-06-26 DIAGNOSIS — Z87891 Personal history of nicotine dependence: Secondary | ICD-10-CM | POA: Insufficient documentation

## 2011-06-26 DIAGNOSIS — K6289 Other specified diseases of anus and rectum: Secondary | ICD-10-CM

## 2011-06-26 DIAGNOSIS — K649 Unspecified hemorrhoids: Secondary | ICD-10-CM

## 2011-06-26 LAB — CBC
Hemoglobin: 12.3 g/dL — ABNORMAL LOW (ref 13.0–17.0)
MCH: 32.1 pg (ref 26.0–34.0)
MCHC: 34.4 g/dL (ref 30.0–36.0)
Platelets: 208 10*3/uL (ref 150–400)

## 2011-06-26 MED ORDER — OXYCODONE-ACETAMINOPHEN 5-325 MG PO TABS: 1.0000 | ORAL_TABLET | Freq: Three times a day (TID) | ORAL | Status: AC | PRN

## 2011-06-26 MED ORDER — PROMETHAZINE HCL 25 MG RE SUPP: 25.0000 mg | Freq: Four times a day (QID) | RECTAL | Status: DC | PRN

## 2011-06-26 MED ORDER — HYDROMORPHONE HCL PF 2 MG/ML IJ SOLN
2.0000 mg | Freq: Once | INTRAMUSCULAR | Status: AC
  Administered 2011-06-26: 2 mg via INTRAMUSCULAR
  Filled 2011-06-26: qty 1

## 2011-06-26 NOTE — ED Notes (Signed)
C/o rectal bleeding x 30 minutes.  States he was seen in ED on Monday for hemorrhoids.  No BM x 4-5 days.

## 2011-06-26 NOTE — Discharge Instructions (Signed)
Fiber Content in Foods Drinking plenty of fluids and consuming foods high in fiber can help with constipation. See the list below for the fiber content of some common foods. Starches and Grains / Dietary Fiber (g)  Cheerios, 1 cup / 3 g   Kellogg's Corn Flakes, 1 cup / 0.7 g   Rice Krispies, 1  cup / 0.3 g   Quaker Oat Life Cereal,  cup / 2.1 g   Oatmeal, instant (cooked),  cup / 2 g   Kellogg's Frosted Mini Wheats, 1 cup / 5.1 g   Rice, brown, long-grain (cooked), 1 cup / 3.5 g   Rice, white, long-grain (cooked), 1 cup / 0.6 g   Macaroni, cooked, enriched, 1 cup / 2.5 g  Legumes / Dietary Fiber (g)  Beans, baked, canned, plain or vegetarian,  cup / 5.2 g   Beans, kidney, canned,  cup / 6.8 g   Beans, pinto, dried (cooked),  cup / 7.7 g   Beans, pinto, canned,  cup / 5.5 g  Breads and Crackers / Dietary Fiber (g)  Graham crackers, plain or honey, 2 squares / 0.7 g   Saltine crackers, 3 squares / 0.3 g   Pretzels, plain, salted, 10 pieces / 1.8 g   Bread, whole-wheat, 1 slice / 1.9 g   Bread, white, 1 slice / 0.7 g   Bread, raisin, 1 slice / 1.2 g   Bagel, plain, 3 oz / 2 g   Tortilla, flour, 1 oz / 0.9 g   Tortilla, corn, 1 small / 1.5 g   Bun, hamburger or hotdog, 1 small / 0.9 g  Fruits / Dietary Fiber (g)  Apple, raw with skin, 1 medium / 4.4 g   Applesauce, sweetened,  cup / 1.5 g   Banana,  medium / 1.5 g   Grapes, 10 grapes / 0.4 g   Orange, 1 small / 2.3 g   Raisin, 1.5 oz / 1.6 g   Melon, 1 cup / 1.4 g  Vegetables / Dietary Fiber (g)  Green beans, canned,  cup / 1.3 g   Carrots (cooked),  cup / 2.3 g   Broccoli (cooked),  cup / 2.8 g   Peas, frozen (cooked),  cup / 4.4 g   Potatoes, mashed,  cup / 1.6 g   Lettuce, 1 cup / 0.5 g   Corn, canned,  cup / 1.6 g   Tomato,  cup / 1.1 g  Document Released: 05/31/2006 Document Revised: 01/01/2011 Document Reviewed: 07/26/2006 [employer] Patient Information 2012  [employer] 

## 2011-06-26 NOTE — Telephone Encounter (Signed)
Patient was seen in the ER last night for abdominal pain, rectal pain and rectal bleeding.  He was discharged home last night and instructed to continue his lidocaine, colace, and hydrocortisone suppositories.  He states today he developed RLQ pain and vomiting.  He has had 3 episodes of vomiting this am.  Dr. Leone Payor please advise

## 2011-06-26 NOTE — ED Notes (Signed)
Pt and family reports pt has been experiencing hemorrhoidal bleeding (spotting) x4 months - x4 days again hemorrhoid pain became unable and tonight pt began experiencing large amts of blood from rectum. No gross bleeding noted on exam. Pt w/ grimacing - A&Ox4 - pt A&Ox4 - family at bedside x1.

## 2011-06-26 NOTE — Telephone Encounter (Signed)
Family member called stating pt now having large amts of BRBPR. Pt felt urge to have BM and passed bright red blood.  Stating bleeding has continued even after standing.  Bleeding at present is painless.  Was seen in the office today for Crohn's ileitis and internal hemorrhoids.  Hemorrhoids have recently been a problem and additional treatment recommended today.  Rectal bleeding likely hemorrhoidal, but due to active bleeding which family member states is ongoing, I have recommended evaluation now in the ED. Family member voices understanding.

## 2011-06-26 NOTE — ED Provider Notes (Signed)
History     CSN: [id number]  Arrival date & time 06/26/11  4098   First MD Initiated Contact with Patient 06/26/11 [phone]      Chief Complaint  Patient presents with  . Rectal Bleeding    Patient is a 41 y.o. male presenting with hematochezia. The history is provided by the patient and the spouse.  Rectal Bleeding  The current episode started today. The onset was sudden. The problem occurs rarely. The problem has been gradually improving. The pain is severe. The stool is described as bloody. Associated symptoms include nausea and rectal pain. Pertinent negatives include no fever and no vomiting.  worsened by defecation and improved by rest  Pt reports that he was recently diagnosed with hemorrhoids They have been painful for past several days He reports this evening he had a bowel movement and it "was all blood" No melena No vomiting He reports seen by Dr Leone Payor his GI specialist today and was advised to continue therapy for his hemorrhoids.  He has h/o crohns but denies bloody diarrhea previously. He has been given surgical referral  Past Medical History  Diagnosis Date  . Anxiety and depression   . History of mixed drug abuse     marijuana - EtOH  . History of drug overdose     on Xanax  . Chronic headaches   . Nephrolithiasis   . Allergy     SINUS  . Anxiety   . Depression   . Arthritis     HAND  . Asthma     AS CHILD  . Neuromuscular disorder     NERVE/MUSCLE CUT AND REPLACED  . Substance abuse   . Internal hemorrhoids   . Gastritis   . Crohn's ileitis 2012  . Colon ulcer, aphthous 2012    per capsule endoscopy  . External hemorrhoids     Past Surgical History  Procedure Date  . Right arm surgery   . Tonsillectomy   . Colonoscopy w/ biopsies 11/25/2010    internal hemorrhoids, Crohn's ileitis suspected  . Upper gastrointestinal endoscopy 11/25/2010    gastritis  . Givens capsule study 01/13/2011    Distal small bowel ulcers consistent with Crohn's  ileitis    Family History  Problem Relation Age of Onset  . Cancer Maternal Aunt     ?  [name] Aneurysm Mother   . Stroke Mother   . Thyroid disease Mother   . Heart disease Mother   . Emphysema Maternal Grandmother   . Colon cancer Neg Hx     History  Substance Use Topics  . Smoking status: Former Smoker    Quit date: 01/26/2009  . Smokeless tobacco: Never Used  . Alcohol Use: No     occasionally      Review of Systems  Constitutional: Negative for fever.  Gastrointestinal: Positive for nausea, hematochezia and rectal pain. Negative for vomiting.  All other systems reviewed and are negative.    Allergies  Review of patient's allergies indicates no known allergies.  Home Medications   Current Outpatient Rx  Name Route Sig Dispense Refill  . DOCUSATE SODIUM 100 MG PO CAPS Oral Take 1 capsule (100 mg total) by mouth 2 (two) times daily. 60 capsule 0    Til stools are soft and easily passed than 1 tab d ...  . HYDROCORTISONE ACETATE 25 MG RE SUPP Rectal Place 1 suppository (25 mg total) rectally 2 (two) times daily. 12 suppository 0  . INFLIXIMAB 100 MG IV SOLR  Infuse 5 mg/kg at week 0,2,6 then q 8 weeks 1 each 0  . LIDOCAINE 0.5 % EX GEL Apply externally Apply topically as needed.    [name] LIDOCAINE (ANORECTAL) 5 % EX CREA  Use as directed on the box 1 Tube 0  . OMEPRAZOLE 20 MG PO CPDR Oral Take 20 mg by mouth daily.    [name] POLYETHYLENE GLYCOL 3350 PO POWD Oral Take 17 g by mouth daily. 255 g 3  . PREDNISONE 10 MG PO TABS Oral Take 30 mg by mouth daily.      BP 124/93  Pulse 103  Temp(Src) 98.5 F (36.9 C) (Oral)  Resp 16  SpO2 97%  Physical Exam CONSTITUTIONAL: Well developed/well nourished, pt is uncomfortable appearing HEAD AND FACE: Normocephalic/atraumatic EYES: EOMI/PERRL ENMT: Mucous membranes moist NECK: supple no meningeal signs SPINE:entire spine nontender CV: S1/S2 noted, no murmurs/rubs/gallops noted LUNGS: Lungs are clear to auscultation  bilaterally, no apparent distress ABDOMEN: soft, nontender, no rebound or guarding GU:no cva tenderness Rectal - external hemorrhoids noted, none are thrombosed.  No rectal abscess.  Dried blood noted surrounding hemorrhoid but no active bleeding noted NEURO: Pt is awake/alert, moves all extremitiesx4 EXTREMITIES: pulses normal, full ROM SKIN: warm, color normal PSYCH: anxious  ED Course  Procedures    Labs Reviewed  CBC   1:20 AM Pt already on therapy for hemorrhoids but now with bleeding which concerned him He is uncomfortable pain meds given Will check CBC   The patient appears reasonably screened and/or stabilized for discharge and I doubt any other medical condition or other [facility] requiring further screening, evaluation, or treatment in the ED at this time prior to discharge.    MDM  Nursing notes reviewed and considered in documentation Previous records reviewed and considered All labs/vitals reviewed and considered         Joya Gaskins, MD 06/26/11 [phone]

## 2011-06-26 NOTE — Progress Notes (Signed)
Quick Note:  CXR is ok See PCP for persistent cough ?'s  Keep [facility] appointment re: bleeding hemorrhoids ______

## 2011-06-26 NOTE — Telephone Encounter (Signed)
The patient's wife has been notified.  She will come pick up the paperwork after work today

## 2011-06-26 NOTE — ED Notes (Signed)
Rx given x1 D/c instructions reviewed w/ pt and family - pt and family deny any further questions or concerns at present. Pt ambulating independently w/ steady gait on d/c in no acute distress, A&Ox4.  

## 2011-06-26 NOTE — Telephone Encounter (Signed)
Clear liquids and advance as tolerated. If persistent problems over next 1-2 days then go to ED.  Promethazine suppository 25 mg as needed every 8 hours for nuasea and vomiting #12 with no refills.   Also let them know his papers are filled out and will be at front desk.

## 2011-06-27 DIAGNOSIS — S82899A Other fracture of unspecified lower leg, initial encounter for closed fracture: Secondary | ICD-10-CM

## 2011-06-27 HISTORY — DX: Other fracture of unspecified lower leg, initial encounter for closed fracture: S82.899A

## 2011-07-14 ENCOUNTER — Emergency Department (HOSPITAL_COMMUNITY)
Admission: EM | Admit: 2011-07-14 | Discharge: 2011-07-14 | Disposition: A | Discharge status: Home / Self Care | Payer: Medicaid Other | Attending: Emergency Medicine | Admitting: Emergency Medicine

## 2011-07-14 ENCOUNTER — Ambulatory Visit (INDEPENDENT_AMBULATORY_CARE_PROVIDER_SITE_OTHER): Payer: Self-pay | Admitting: Surgery

## 2011-07-14 ENCOUNTER — Encounter (HOSPITAL_COMMUNITY): Payer: Self-pay | Admitting: Emergency Medicine

## 2011-07-14 ENCOUNTER — Emergency Department (HOSPITAL_COMMUNITY): Payer: Medicaid Other

## 2011-07-14 DIAGNOSIS — Y92009 Unspecified place in unspecified non-institutional (private) residence as the place of occurrence of the external cause: Secondary | ICD-10-CM | POA: Insufficient documentation

## 2011-07-14 DIAGNOSIS — W1789XA Other fall from one level to another, initial encounter: Secondary | ICD-10-CM | POA: Insufficient documentation

## 2011-07-14 DIAGNOSIS — S92309A Fracture of unspecified metatarsal bone(s), unspecified foot, initial encounter for closed fracture: Secondary | ICD-10-CM

## 2011-07-14 DIAGNOSIS — S82892A Other fracture of left lower leg, initial encounter for closed fracture: Secondary | ICD-10-CM

## 2011-07-14 MED ORDER — OXYCODONE-ACETAMINOPHEN 5-325 MG PO TABS: ORAL_TABLET | ORAL | Status: DC

## 2011-07-14 MED ORDER — OXYCODONE-ACETAMINOPHEN 5-325 MG PO TABS
2.0000 | ORAL_TABLET | Freq: Once | ORAL | Status: AC
  Administered 2011-07-14: 2 via ORAL
  Filled 2011-07-14: qty 2

## 2011-07-14 NOTE — ED Notes (Signed)
Pt sts fell out of tree 2 days ago and now c/o left ankle pain with swelling; CMS intact

## 2011-07-14 NOTE — Progress Notes (Signed)
Orthopedic Tech Progress Note Patient Details:  Gary Barton 24-Apr-1970 [id number]  Ortho Devices Type of Ortho Device: Post (short) splint;Stirrup splint;Crutches Splint Material: Fiberglass Ortho Device/Splint Location: left leg Ortho Device/Splint Interventions: Application   Roneka Gilpin 07/14/2011, 1:43 PM

## 2011-07-14 NOTE — ED Provider Notes (Signed)
History   This chart was scribed for Gary Barton. Gary Lamas, MD by Gary Barton. The patient was seen in room TR08C/TR08C and the patient's care was started at 11:29 AM     CSN: [id number]  Arrival date & time 07/14/11  1116   First MD Initiated Contact with Patient 07/14/11 1127      Chief Complaint  Patient presents with  . Ankle Pain    (Consider location/radiation/quality/duration/timing/severity/associated sxs/prior treatment) Patient is a 41 y.o. male presenting with ankle pain. The history is provided by the patient. No language interpreter was used.  Ankle Pain  The incident occurred 2 days ago. The incident occurred in the yard. The injury mechanism was a fall. The pain is present in the left ankle. The quality of the pain is described as aching. The pain has been constant since onset. Associated symptoms include inability to bear weight. The symptoms are aggravated by bearing weight.    Gary Barton is a 41 y.o. male who presents to the Emergency Department complaining of moderate, episodic ankle pain onset two days ago with associated symptoms of swelling. The pt states he fell out of a pine tree two days ago while cutting some limbs. The pt states he is not able to walk on his left ankle. Modifying factors include walking, application of pressure which intensifies the pain, elevation which provides moderate relief, application of ice which provides moderate relief. Pt has a hx of broken ankle, chrones disease.   Pt denies any other medical problems.   PCP is Dr. Tanya Barton.    Past Medical History  Diagnosis Date  . Anxiety and depression   . History of mixed drug abuse     marijuana - EtOH  . History of drug overdose     on Xanax  . Chronic headaches   . Nephrolithiasis   . Allergy     SINUS  . Anxiety   . Depression   . Arthritis     HAND  . Asthma     AS CHILD  . Neuromuscular disorder     NERVE/MUSCLE CUT AND REPLACED  . Substance abuse   . Internal hemorrhoids    . Gastritis   . Crohn's ileitis 2012  . Colon ulcer, aphthous 2012    per capsule endoscopy  . External hemorrhoids     Past Surgical History  Procedure Date  . Right arm surgery   . Tonsillectomy   . Colonoscopy w/ biopsies 11/25/2010    internal hemorrhoids, Crohn's ileitis suspected  . Upper gastrointestinal endoscopy 11/25/2010    gastritis  . Givens capsule study 01/13/2011    Distal small bowel ulcers consistent with Crohn's ileitis    Family History  Problem Relation Age of Onset  . Cancer Maternal Aunt     ?  Gary Barton Aneurysm Mother   . Stroke Mother   . Thyroid disease Mother   . Heart disease Mother   . Emphysema Maternal Grandmother   . Colon cancer Neg Hx     History  Substance Use Topics  . Smoking status: Former Smoker    Quit date: 01/26/2009  . Smokeless tobacco: Never Used  . Alcohol Use: No     occasionally      Review of Systems  All other systems reviewed and are negative.    10 Systems reviewed and all are negative for acute change except as noted in the HPI.    Allergies  Review of patient's allergies indicates no known allergies.  Home Medications   Current Outpatient Rx  Name Route Sig Dispense Refill  . INFLIXIMAB 100 MG IV SOLR Intravenous Inject 5 mg/kg into the vein See admin instructions. Infuse 5mg /kg at week 0, 2, 6, then every 8 weeks.    Gary Barton LIDOCAINE (ANORECTAL) 5 % EX CREA  1 application as directed.    Gary Barton PREDNISONE 10 MG PO TABS Oral Take 30 mg by mouth daily.    . OXYCODONE-ACETAMINOPHEN 5-325 MG PO TABS  1-2 tablets po q 6 hours prn moderate to severe pain 30 tablet 0    BP 123/81  Pulse 96  Temp 98.3 F (36.8 C) (Oral)  Resp 20  Ht 6\' 8"  (2.032 m)  Wt 170 lb (77.111 kg)  BMI 18.68 kg/m2  SpO2 100%  Physical Exam  Nursing note and vitals reviewed. Constitutional: He is oriented to person, place, and time. He appears well-developed and well-nourished.  HENT:  Head: Atraumatic.  Nose: Nose normal.  Eyes:  Conjunctivae and EOM are normal.  Neck: Normal range of motion. Neck supple.  Cardiovascular: Normal rate.   Pulmonary/Chest: Effort normal.  Musculoskeletal: He exhibits tenderness.       On left ankle and foot: Skin abrasion on lateral malleolus. Medial malleolar tenderness. Fifth metatarsal tenderness. Anterior dorsum tenderness, no crepitance noted. Soft compartments of lower leg  Neurological: He is alert and oriented to person, place, and time. He has normal strength. No sensory deficit.  Skin: Skin is warm and dry. Abrasion noted. No laceration and no rash noted. No pallor.  Psychiatric: He has a normal mood and affect. His behavior is normal.    ED Course  Procedures (including critical care time)  DIAGNOSTIC STUDIES: Oxygen Saturation is 100% on room air, normal by my interpretation.    COORDINATION OF CARE:     Labs Reviewed - No data to display Results for orders placed during the hospital encounter of 06/26/11  CBC      Component Value Range   WBC 10.8 (*) 4.0 - 10.5 K/uL   RBC 3.83 (*) 4.22 - 5.81 MIL/uL   Hemoglobin 12.3 (*) 13.0 - 17.0 g/dL   HCT 13.0 (*) 86.5 - 78.4 %   MCV 93.5  78.0 - 100.0 fL   MCH 32.1  26.0 - 34.0 pg   MCHC 34.4  30.0 - 36.0 g/dL   RDW 69.6  29.5 - 28.4 %   Platelets 208  150 - 400 K/uL     Dg Ankle Complete Left  07/14/2011  *RADIOLOGY REPORT*  Clinical Data: 41 year old male status post fall with pain.  LEFT ANKLE COMPLETE - 3+ VIEW  Comparison: Left foot series from the same day.  Findings: Fifth metatarsal fracture as detailed on the separate foot series.  Mortise joint alignment preserved.  Talar dome intact.  Small ossific fragments about the medial talus and medial malleolus. There is either a small anterior joint effusion or anterior soft tissue swelling.  Calcaneus intact.  No other fracture identified.  IMPRESSION: 1.  Possible small fracture fragment along the medial mortise joint. 2.  Anterior joint effusion versus anterior soft  tissue swelling/contusion. 3.  Fifth metatarsal fracture, see left foot series.  Original Report Authenticated By: [name], M.D.   Dg Foot Complete Left  07/14/2011  *RADIOLOGY REPORT*  Clinical Data: 41 year old male with left foot pain, injury 2 days ago.  The patient states remote fifth digit fracture (1994).  LEFT FOOT - COMPLETE 3+ VIEW  Comparison: None.  Findings: Fracture  through the base of the fifth metatarsal has non sclerotic margins and there may be mild periosteal reaction.  Calcaneus intact.  Joint spaces preserved. Bone mineralization is within normal limits.  No other fracture or dislocation identified.  IMPRESSION: Mildly distracted transverse fracture through the base of the left fifth metatarsal appears to be acute or subacute.  Original Report Authenticated By: [name], M.D.      1. Metatarsal fracture   2. Ankle fracture, left     I reviewed films myself and discussed radiologist findings with pt and family.  Splinted, referral to orthopedist for follow up. Crutches, Rx for analgesics provided.  Soft compartments, good distal pulses.    MDM  I personally performed the services described in this documentation, which was scribed in my presence. The recorded information has been reviewed and considered.    Gary Barton. Gary Lamas, MD 07/17/11 2034

## 2011-07-14 NOTE — Discharge Instructions (Signed)
Ankle Fracture A fracture is a break in the bone. A cast or splint is used to protect and keep your injured bone from moving.  HOME CARE INSTRUCTIONS   Use your crutches as directed.   To lessen the swelling, keep the injured leg elevated while sitting or lying down.   Apply ice to the injury for 15 to 20 minutes, 3 to 4 times per day while awake for 2 days. Put the ice in a plastic bag and place a thin towel between the bag of ice and your cast.   If you have a plaster or fiberglass cast:   Do not try to scratch the skin under the cast using sharp or pointed objects.   Check the skin around the cast every day. You may put lotion on any red or sore areas.   Keep your cast dry and clean.   If you have a plaster splint:   Wear the splint as directed.   You may loosen the elastic around the splint if your toes become numb, tingle, or turn cold or blue.   Do not put pressure on any part of your cast or splint; it may break. Rest your cast only on a pillow the first 24 hours until it is fully hardened.   Your cast or splint can be protected during bathing with a plastic bag. Do not lower the cast or splint into water.   Take medications as directed by your caregiver. Only take over-the-counter or prescription medicines for pain, discomfort, or fever as directed by your caregiver.   Do not drive a vehicle until your caregiver specifically tells you it is safe to do so.   If your caregiver has given you a follow-up appointment, it is very important to keep that appointment. Not keeping the appointment could result in a chronic or permanent injury, pain, and disability. If there is any problem keeping the appointment, you must call back to this facility for assistance.  SEEK IMMEDIATE MEDICAL CARE IF:   Your cast gets damaged or breaks.   You have continued severe pain or more swelling than you did before the cast was put on.   Your skin or toenails below the injury turn blue or gray,  or feel cold or numb.   There is a bad smell or new stains and/or purulent (pus like) drainage coming from under the cast.  If you do not have a window in your cast for observing the wound, a discharge or minor bleeding may show up as a stain on the outside of your cast. Report these findings to your caregiver. MAKE SURE YOU:   Understand these instructions.   Will watch your condition.   Will get help right away if you are not doing well or get worse.  Document Released: 01/10/2000 Document Revised: 01/01/2011 Document Reviewed: 08/16/2007 [facility] Patient Information 2012 [place], [place].   Narcotic and benzodiazepine use may cause drowsiness, slowed breathing or dependence.  Please use with caution and do not drive, operate machinery or watch young children alone while taking them.  Taking combinations of these medications or drinking alcohol will potentiate these effects.

## 2011-07-17 ENCOUNTER — Encounter (INDEPENDENT_AMBULATORY_CARE_PROVIDER_SITE_OTHER): Payer: Self-pay | Admitting: Surgery

## 2011-07-17 ENCOUNTER — Encounter (HOSPITAL_COMMUNITY): Payer: Self-pay | Admitting: Emergency Medicine

## 2011-07-19 ENCOUNTER — Encounter (HOSPITAL_COMMUNITY): Payer: Self-pay | Admitting: *Deleted

## 2011-07-19 ENCOUNTER — Emergency Department (HOSPITAL_COMMUNITY)
Admission: EM | Admit: 2011-07-19 | Discharge: 2011-07-19 | Disposition: A | Discharge status: Home / Self Care | Payer: Medicaid Other | Attending: Emergency Medicine | Admitting: Emergency Medicine

## 2011-07-19 DIAGNOSIS — W1789XA Other fall from one level to another, initial encounter: Secondary | ICD-10-CM | POA: Insufficient documentation

## 2011-07-19 DIAGNOSIS — M129 Arthropathy, unspecified: Secondary | ICD-10-CM | POA: Insufficient documentation

## 2011-07-19 DIAGNOSIS — F411 Generalized anxiety disorder: Secondary | ICD-10-CM | POA: Insufficient documentation

## 2011-07-19 DIAGNOSIS — S82892A Other fracture of left lower leg, initial encounter for closed fracture: Secondary | ICD-10-CM

## 2011-07-19 DIAGNOSIS — F329 Major depressive disorder, single episode, unspecified: Secondary | ICD-10-CM | POA: Insufficient documentation

## 2011-07-19 DIAGNOSIS — S92309A Fracture of unspecified metatarsal bone(s), unspecified foot, initial encounter for closed fracture: Secondary | ICD-10-CM | POA: Insufficient documentation

## 2011-07-19 DIAGNOSIS — F3289 Other specified depressive episodes: Secondary | ICD-10-CM | POA: Insufficient documentation

## 2011-07-19 DIAGNOSIS — S82899A Other fracture of unspecified lower leg, initial encounter for closed fracture: Secondary | ICD-10-CM | POA: Insufficient documentation

## 2011-07-19 MED ORDER — NAPROXEN 500 MG PO TABS: 500.0000 mg | ORAL_TABLET | Freq: Two times a day (BID) | ORAL | Status: DC

## 2011-07-19 MED ORDER — OXYCODONE-ACETAMINOPHEN 5-325 MG PO TABS: ORAL_TABLET | ORAL | Status: DC

## 2011-07-19 MED ORDER — OXYCODONE-ACETAMINOPHEN 5-325 MG PO TABS
1.0000 | ORAL_TABLET | Freq: Once | ORAL | Status: AC
  Administered 2011-07-19: 1 via ORAL
  Filled 2011-07-19: qty 1

## 2011-07-19 NOTE — ED Notes (Signed)
Ortho tech paged for cam walker 

## 2011-07-19 NOTE — ED Notes (Signed)
Reports being seen here in past for same, still having pain and swelling to left ankle.
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
## 2011-07-19 NOTE — Progress Notes (Signed)
Orthopedic Tech Progress Note Patient Details:  Gary Barton 03-Jan-1971 [id number]  Ortho Devices Type of Ortho Device: CAM walker Ortho Device/Splint Location: left LE Ortho Device/Splint Interventions: Application   Kierah Goatley T 07/19/2011, 11:54 AM

## 2011-07-19 NOTE — Discharge Instructions (Signed)
Please discuss taking naproxen with your gastroenterologist before beginning this medication. Please followup with [facility] orthopedics as previously scheduled. If you require additional followup before your next appointment please consider contacting their office initially. Ankle Fracture A fracture is a break in the bone. A cast or splint is used to protect and keep your injured bone from moving.  HOME CARE INSTRUCTIONS   Use your crutches as directed.   To lessen the swelling, keep the injured leg elevated while sitting or lying down.   Apply ice to the injury for 15 to 20 minutes, 3 to 4 times per day while awake for 2 days. Put the ice in a plastic bag and place a thin towel between the bag of ice and your cast.   If you have a plaster or fiberglass cast:   Do not try to scratch the skin under the cast using sharp or pointed objects.   Check the skin around the cast every day. You may put lotion on any red or sore areas.   Keep your cast dry and clean.   If you have a plaster splint:   Wear the splint as directed.   You may loosen the elastic around the splint if your toes become numb, tingle, or turn cold or blue.   Do not put pressure on any part of your cast or splint; it may break. Rest your cast only on a pillow the first 24 hours until it is fully hardened.   Your cast or splint can be protected during bathing with a plastic bag. Do not lower the cast or splint into water.   Take medications as directed by your caregiver. Only take over-the-counter or prescription medicines for pain, discomfort, or fever as directed by your caregiver.   Do not drive a vehicle until your caregiver specifically tells you it is safe to do so.   If your caregiver has given you a follow-up appointment, it is very important to keep that appointment. Not keeping the appointment could result in a chronic or permanent injury, pain, and disability. If there is any problem keeping the appointment,  you must call back to this facility for assistance.  SEEK IMMEDIATE MEDICAL CARE IF:   Your cast gets damaged or breaks.   You have continued severe pain or more swelling than you did before the cast was put on.   Your skin or toenails below the injury turn blue or gray, or feel cold or numb.   There is a bad smell or new stains and/or purulent (pus like) drainage coming from under the cast.  If you do not have a window in your cast for observing the wound, a discharge or minor bleeding may show up as a stain on the outside of your cast. Report these findings to your caregiver. MAKE SURE YOU:   Understand these instructions.   Will watch your condition.   Will get help right away if you are not doing well or get worse.  Document Released: 01/10/2000 Document Revised: 01/01/2011 Document Reviewed: 08/16/2007 [facility] Patient Information 2012 [place], [place].Foot Fracture Your caregiver has diagnosed you as having a foot fracture (broken bone). Your foot has many bones. You have a fracture, or break, in one of these bones. In some cases, your doctor may put on a splint or removable fracture boot until the swelling in your foot has lessened. A cast may or may not be required. HOME CARE INSTRUCTIONS  If you do not have a cast or splint:  You may bear weight on your injured foot as tolerated or advised.   Do not put any weight on your injured foot for as long as directed by your caregiver. Slowly increase the amount of time you walk on the foot as the pain and swelling allows or as advised.   Use crutches until you can bear weight without pain. A gradual increase in weight bearing may help.   Apply ice to the injury for 15 to 20 minutes each hour while awake for the first 2 days. Put the ice in a plastic bag and place a towel between the bag of ice and your skin.   If an ace bandage (stretchy, elastic wrapping bandage) was applied, you may re-wrap it if ankle is more painful or your toes  become cold and swollen.  If you have a cast or splint:  Use your crutches for as long as directed by your caregiver.   To lessen the swelling, keep the injured foot elevated on pillows while lying down or sitting. Elevate your foot above your heart.   Apply ice to the injury for 15 to 20 minutes each hour while awake for the first 2 days. Put the ice in a plastic bag and place a thin towel between the bag of ice and your cast.   Plaster or fiberglass cast:   Do not try to scratch the skin under the cast using a sharp or pointed object down the cast.   Check the skin around the cast every day. You may put lotion on any red or sore areas.   Keep your cast clean and dry.   Plaster splint:   Wear the splint until you are seen for a follow-up examination.   You may loosen the elastic around the splint if your toes become numb, tingle, or turn blue or cold. Do not rest it on anything harder than a pillow in the first 24 hours.   Do not put pressure on any part of your splint. Use your crutches as directed.   Keep your splint dry. It can be protected during bathing with a plastic bag. Do not lower the splint into water.   If you have a fracture boot you may remove it to shower. Bear weight only as instructed by your caregiver.   Only take over-the-counter or prescription medicines for pain, discomfort, or fever as directed by your caregiver.  SEEK IMMEDIATE MEDICAL CARE IF:   Your cast gets damaged or breaks.   You have continued severe pain or more swelling than you did before the cast was put on.   Your skin or nails of your casted foot turn blue, gray, feel cold or numb.   There is a bad smell from your cast.   There is severe pain with movement of your toes.   There are new stains and/or drainage coming from under the cast.  MAKE SURE YOU:   Understand these instructions.   Will watch your condition.   Will get help right away if you are not doing well or get worse.    Document Released: 01/10/2000 Document Revised: 01/01/2011 Document Reviewed: 02/16/2008 [facility] Patient Information 2012 [place], [place].

## 2011-07-21 ENCOUNTER — Encounter (HOSPITAL_COMMUNITY): Payer: Self-pay

## 2011-07-21 ENCOUNTER — Encounter (HOSPITAL_COMMUNITY)
Admission: RE | Admit: 2011-07-21 | Discharge: 2011-07-21 | Disposition: A | Discharge status: Home / Self Care | Payer: Medicaid Other | Source: Ambulatory Visit | Attending: Internal Medicine | Admitting: Internal Medicine

## 2011-07-21 VITALS — BP 110/75 | HR 65 | Temp 98.0°F | Resp 18 | Ht 68.0 in | Wt 176.0 lb

## 2011-07-21 DIAGNOSIS — K5090 Crohn's disease, unspecified, without complications: Secondary | ICD-10-CM

## 2011-07-21 HISTORY — DX: Other fracture of unspecified lower leg, initial encounter for closed fracture: S82.899A

## 2011-07-21 MED ORDER — SODIUM CHLORIDE 0.9 % IV SOLN
5.0000 mg/kg | Freq: Once | INTRAVENOUS | Status: AC
  Administered 2011-07-21: 400 mg via INTRAVENOUS
  Filled 2011-07-21: qty 40

## 2011-07-21 MED ORDER — DIPHENHYDRAMINE HCL 25 MG PO CAPS
50.0000 mg | ORAL_CAPSULE | ORAL | Status: DC
  Administered 2011-07-21: 50 mg via ORAL

## 2011-07-21 MED ORDER — DIPHENHYDRAMINE HCL 25 MG PO CAPS
ORAL_CAPSULE | ORAL | Status: AC
  Administered 2011-07-21: 50 mg via ORAL
  Filled 2011-07-21: qty 2

## 2011-07-21 MED ORDER — ACETAMINOPHEN 325 MG PO TABS
ORAL_TABLET | ORAL | Status: AC
  Administered 2011-07-21: 650 mg via ORAL
  Filled 2011-07-21: qty 2

## 2011-07-21 MED ORDER — SODIUM CHLORIDE 0.9 % IV SOLN
INTRAVENOUS | Status: AC
  Administered 2011-07-21: 12:00:00 via INTRAVENOUS

## 2011-07-21 MED ORDER — ACETAMINOPHEN 325 MG PO TABS
650.0000 mg | ORAL_TABLET | ORAL | Status: DC
  Administered 2011-07-21: 650 mg via ORAL

## 2011-07-21 NOTE — Discharge Instructions (Signed)
Refer to printed sheet for next appointment. Short Stay Phone # [phone] YOUR NEXT APPOINTMENT IN 8 WEEKS FOR [facility] IN SHORT STAY IS Tuesday 09/15/11 AT 1100 AM PT DECLINES ANY INFORMATION ON REMICADE

## 2011-08-04 ENCOUNTER — Encounter (HOSPITAL_COMMUNITY): Payer: Medicaid Other

## 2011-08-18 ENCOUNTER — Encounter (HOSPITAL_COMMUNITY): Payer: Self-pay | Admitting: *Deleted

## 2011-08-18 ENCOUNTER — Emergency Department (HOSPITAL_COMMUNITY): Payer: No Typology Code available for payment source

## 2011-08-18 ENCOUNTER — Emergency Department (HOSPITAL_COMMUNITY)
Admission: EM | Admit: 2011-08-18 | Discharge: 2011-08-18 | Disposition: A | Discharge status: Home / Self Care | Payer: No Typology Code available for payment source | Attending: Emergency Medicine | Admitting: Emergency Medicine

## 2011-08-18 DIAGNOSIS — IMO0002 Reserved for concepts with insufficient information to code with codable children: Secondary | ICD-10-CM | POA: Insufficient documentation

## 2011-08-18 DIAGNOSIS — S92309A Fracture of unspecified metatarsal bone(s), unspecified foot, initial encounter for closed fracture: Secondary | ICD-10-CM

## 2011-08-18 DIAGNOSIS — Z87891 Personal history of nicotine dependence: Secondary | ICD-10-CM | POA: Insufficient documentation

## 2011-08-18 MED ORDER — ACETAMINOPHEN-CODEINE #3 300-30 MG PO TABS: 1.0000 | ORAL_TABLET | Freq: Four times a day (QID) | ORAL | Status: AC | PRN
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
## 2011-08-18 NOTE — ED Provider Notes (Signed)
Medical screening examination/treatment/procedure(s) were conducted as a shared visit with non-physician practitioner(s) and myself.  I personally evaluated the patient during the encounter  Doug Sou, MD 08/18/11 1538
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
## 2011-08-28 ENCOUNTER — Telehealth: Payer: Self-pay | Admitting: Internal Medicine

## 2011-08-28 NOTE — Telephone Encounter (Signed)
Left message for patient to call back  

## 2011-09-01 NOTE — Telephone Encounter (Signed)
I spoke with the patient's wife.  She reports multiple complaints.  I have asked her to have him call and give me a an update of all symptoms.

## 2011-09-03 NOTE — Telephone Encounter (Signed)
No return call from the patient or his wife.  I have left him another message asking him to call back with an update on his symptoms

## 2011-09-04 ENCOUNTER — Encounter (HOSPITAL_COMMUNITY): Admission: RE | Admit: 2011-09-04 | Payer: Medicaid Other | Source: Ambulatory Visit

## 2011-09-07 NOTE — Telephone Encounter (Signed)
Dr. Leone Payor he will not return my calls.  He is scheduled for his next remicade on 09/15/11.  Is it ok for him to keep this appt?

## 2011-09-08 ENCOUNTER — Telehealth: Payer: Self-pay | Admitting: Internal Medicine

## 2011-09-08 ENCOUNTER — Ambulatory Visit: Payer: Medicaid Other | Admitting: Physician Assistant

## 2011-09-08 NOTE — Telephone Encounter (Signed)
Patient was a no show for his appt today at 2:30.  I called him to inquire why he missed his appt and he had no reason for not coming. He wanted to reschedule for tomorrow.  I have given him an appt for Monday with Dr. Leone Payor.  I have stressed the importance of keeping his appts or canceling them in advance so other patient's may be seen.

## 2011-09-08 NOTE — Telephone Encounter (Signed)
Patient called see phone note from 09/08/11.  He will come in and see Mike Gip PA today at 2:30

## 2011-09-08 NOTE — Telephone Encounter (Signed)
Patient reports abdominal pain, rectal bleeding and diarrhea.  See previous phone note from 08/28/11.  He will come in today and see Amy Esterwood PA at 2:30

## 2011-09-14 ENCOUNTER — Other Ambulatory Visit: Payer: Self-pay

## 2011-09-14 ENCOUNTER — Ambulatory Visit: Payer: Medicaid Other | Admitting: Internal Medicine

## 2011-09-14 ENCOUNTER — Telehealth: Payer: Self-pay | Admitting: *Deleted

## 2011-09-14 DIAGNOSIS — K5090 Crohn's disease, unspecified, without complications: Secondary | ICD-10-CM

## 2011-09-14 NOTE — Telephone Encounter (Signed)
He is still under care at this point in time so will ok Remicade for tomorrow - needs orders as it has been given - same dose as last time - do not order more than for tomorrow

## 2011-09-14 NOTE — Telephone Encounter (Signed)
We received a call from [facility] at [facility] Stay requesting orders for remicade on this patient (scheduled for 09/15/11). I have spoken to Dr Leone Payor and was told that patient has been quite non compliant recently. He has missed several office visits etc. Therefore, Dr Leone Payor is no comfortable ordering Remicade at this time. Geraldine Contras has been advised of this and Dr Leone Payor states that he will call her to explain at a later time.

## 2011-09-14 NOTE — Telephone Encounter (Signed)
I have placed the orders in EPIC and Geraldine Contras has been notified

## 2011-09-15 ENCOUNTER — Ambulatory Visit (INDEPENDENT_AMBULATORY_CARE_PROVIDER_SITE_OTHER): Payer: Self-pay | Admitting: General Surgery

## 2011-09-15 ENCOUNTER — Encounter (HOSPITAL_COMMUNITY)
Admission: RE | Admit: 2011-09-15 | Discharge: 2011-09-15 | Disposition: A | Discharge status: Home / Self Care | Payer: Medicaid Other | Source: Ambulatory Visit | Attending: Internal Medicine | Admitting: Internal Medicine

## 2011-09-15 VITALS — BP 114/70 | HR 59 | Temp 98.1°F | Resp 18 | Wt 183.5 lb

## 2011-09-15 DIAGNOSIS — K5090 Crohn's disease, unspecified, without complications: Secondary | ICD-10-CM | POA: Insufficient documentation

## 2011-09-15 MED ORDER — DIPHENHYDRAMINE HCL 25 MG PO CAPS: 50.0000 mg | ORAL_CAPSULE | Freq: Once | ORAL | Status: DC

## 2011-09-15 MED ORDER — SODIUM CHLORIDE 0.9 % IV SOLN
INTRAVENOUS | Status: DC
  Administered 2011-09-15: 12:00:00 via INTRAVENOUS

## 2011-09-15 MED ORDER — SODIUM CHLORIDE 0.9 % IV SOLN
5.0000 mg/kg | Freq: Once | INTRAVENOUS | Status: AC
  Administered 2011-09-15: 400 mg via INTRAVENOUS
  Filled 2011-09-15: qty 40

## 2011-09-15 MED ORDER — ACETAMINOPHEN 325 MG PO TABS
650.0000 mg | ORAL_TABLET | Freq: Once | ORAL | Status: AC
  Administered 2011-09-15: 650 mg via ORAL
  Filled 2011-09-15: qty 2

## 2011-09-15 MED ORDER — DIPHENHYDRAMINE HCL 25 MG PO TABS
50.0000 mg | ORAL_TABLET | Freq: Once | ORAL | Status: DC
  Administered 2011-09-15: 50 mg via ORAL
  Filled 2011-09-15 (×2): qty 2

## 2011-09-21 ENCOUNTER — Ambulatory Visit (INDEPENDENT_AMBULATORY_CARE_PROVIDER_SITE_OTHER): Payer: Medicaid Other | Admitting: Surgery

## 2011-09-21 ENCOUNTER — Encounter (INDEPENDENT_AMBULATORY_CARE_PROVIDER_SITE_OTHER): Payer: Self-pay | Admitting: Surgery

## 2011-09-21 VITALS — BP 134/70 | HR 62 | Temp 97.6°F | Ht 66.0 in | Wt 183.8 lb

## 2011-09-21 DIAGNOSIS — K602 Anal fissure, unspecified: Secondary | ICD-10-CM

## 2011-09-21 NOTE — Progress Notes (Signed)
Patient ID: Gary Barton, male   DOB: 1970/03/13, 41 y.o.   MRN: [id number]  Chief Complaint  Patient presents with  . Rectal Problems    HPI Gary Barton is a 41 y.o. male.   HPIThis is a gentleman referred by the emergency department for hemorrhoidal problems. He actually has Crohn's disease and is seen very closely by Dr. Leone Payor. He is to have a significant rectal pain and bleeding. He reports no incontinence. He has been normal for medications without any relief. He reports constipation and no diarrhea. He denies leakage  Past Medical History  Diagnosis Date  . Anxiety and depression   . History of mixed drug abuse     marijuana - EtOH  . History of drug overdose     on Xanax  . Chronic headaches   . Nephrolithiasis   . Allergy     SINUS  . Anxiety   . Depression   . Arthritis     HAND  . Asthma     AS CHILD  . Neuromuscular disorder     NERVE/MUSCLE CUT AND REPLACED  . Substance abuse   . Internal hemorrhoids   . Gastritis   . Crohn's ileitis 2012  . Colon ulcer, aphthous 2012    per capsule endoscopy  . External hemorrhoids   . Ankle fracture 6/13    and foot left    Past Surgical History  Procedure Date  . Right arm surgery   . Tonsillectomy   . Colonoscopy w/ biopsies 11/25/2010    internal hemorrhoids, Crohn's ileitis suspected  . Upper gastrointestinal endoscopy 11/25/2010    gastritis  . Givens capsule study 01/13/2011    Distal small bowel ulcers consistent with Crohn's ileitis    Family History  Problem Relation Age of Onset  . Cancer Maternal Aunt     ?  [name] Aneurysm Mother   . Stroke Mother   . Thyroid disease Mother   . Heart disease Mother   . Emphysema Maternal Grandmother   . Colon cancer Neg Hx     Social History History  Substance Use Topics  . Smoking status: Former Smoker    Quit date: 01/26/2009  . Smokeless tobacco: Never Used  . Alcohol Use: No     occasionally    No Known Allergies  Current Outpatient  Prescriptions  Medication Sig Dispense Refill  . docusate sodium (COLACE) 100 MG capsule Take 100 mg by mouth 2 (two) times daily.      . hydrocortisone (ANUSOL-HC) 25 MG suppository Place 25 mg rectally 2 (two) times daily.      [name] inFLIXimab (REMICADE) 100 MG injection Inject 5 mg/kg into the vein See admin instructions. Infuse 5mg /kg at week 0, 2, 6, then every 8 weeks. As of 08/18/2011, at the every 6 weeks.      . Lidocaine, Anorectal, 5 % CREA Apply 1 application topically as directed.       . naproxen (NAPROSYN) 500 MG tablet Take 1 tablet (500 mg total) by mouth 2 (two) times daily.  60 tablet  0  . oxyCODONE-acetaminophen (PERCOCET) 5-325 MG per tablet Take 0.5 tablets by mouth every 6 (six) hours as needed. For moderate to severe pain.      . predniSONE (DELTASONE) 10 MG tablet Take 20 mg by mouth daily.        Review of Systems Review of Systems  Constitutional: Positive for fever and chills.  HENT: Negative.   Eyes: Negative.   Respiratory:  Negative.   Cardiovascular: Negative.   Gastrointestinal: Positive for constipation, blood in stool, abdominal distention and rectal pain.  Genitourinary: Negative.   Musculoskeletal: Negative.   Hematological: Negative.   Psychiatric/Behavioral: Negative.     Blood pressure 134/70, pulse 62, temperature 97.6 F (36.4 C), temperature source Temporal, height 5\' 6"  (1.676 m), weight 183 lb 12.8 oz (83.371 kg).  Physical Exam Physical Exam  He is uncomfortable in appearance. Lungs clear bilaterally. Cardiovascular is regular rate and rhythm. Rectal exam shows normal external perianal skin. Digital exam was very painful with decreased rectal tone. I could not perform anoscopy secondary to his pain   Assessment    Anal fissure with possible perianal Crohn's disease    Plan    I renewed his Percocet. I will try him on nitroglycerin ointment. I will have him followup with our new colorectal surgeon Dr. Cherylann Banas A 09/21/2011, 4:02 PM

## 2011-10-05 ENCOUNTER — Encounter (INDEPENDENT_AMBULATORY_CARE_PROVIDER_SITE_OTHER): Payer: Self-pay | Admitting: General Surgery

## 2011-10-05 ENCOUNTER — Ambulatory Visit (INDEPENDENT_AMBULATORY_CARE_PROVIDER_SITE_OTHER): Payer: Medicaid Other | Admitting: General Surgery

## 2011-10-05 VITALS — BP 120/82 | HR 72 | Temp 97.7°F | Resp 18 | Ht 68.0 in | Wt 183.0 lb

## 2011-10-05 DIAGNOSIS — K594 Anal spasm: Secondary | ICD-10-CM

## 2011-10-05 NOTE — Patient Instructions (Addendum)

## 2011-10-05 NOTE — Progress Notes (Signed)
Chief Complaint  Patient presents with  . Hemorrhoids    HISTORY: Gary Barton is a 41 y.o. male who presents to the office with anorectal pain.  He has a diagnosis of Crohn's disease.  Other symptoms include nausea, vomiting, constipation and rectal bleeding.  He has tried rectal suppositories, prednisone and remicaide in the past.  Hard bowel movements makes the symptoms worse.  This had been occurring for over a year.  It is intermittent in nature.  His bowel habits are somewhat irregular and his bowel movements are sometimes difficult.  His fiber intake is moderate.  His last colonoscopy was a year ago.  He does have prolapsing tissue sometimes with BM's that he has to push back inside.      Past Medical History  Diagnosis Date  . Anxiety and depression   . History of mixed drug abuse     marijuana - EtOH  . History of drug overdose     on Xanax  . Chronic headaches   . Nephrolithiasis   . Allergy     SINUS  . Anxiety   . Depression   . Arthritis     HAND  . Asthma     AS CHILD  . Neuromuscular disorder     NERVE/MUSCLE CUT AND REPLACED  . Substance abuse   . Internal hemorrhoids   . Gastritis   . Crohn's ileitis 2012  . Colon ulcer, aphthous 2012    per capsule endoscopy  . External hemorrhoids   . Ankle fracture 6/13    and foot left      Past Surgical History  Procedure Date  . Right arm surgery   . Tonsillectomy   . Colonoscopy w/ biopsies 11/25/2010    internal hemorrhoids, Crohn's ileitis suspected  . Upper gastrointestinal endoscopy 11/25/2010    gastritis  . Givens capsule study 01/13/2011    Distal small bowel ulcers consistent with Crohn's ileitis        Current Outpatient Prescriptions  Medication Sig Dispense Refill  . docusate sodium (COLACE) 100 MG capsule Take 100 mg by mouth 2 (two) times daily.      . hydrocortisone (ANUSOL-HC) 25 MG suppository Place 25 mg rectally 2 (two) times daily.      [name] inFLIXimab (REMICADE) 100 MG injection Inject  5 mg/kg into the vein See admin instructions. Infuse 5mg /kg at week 0, 2, 6, then every 8 weeks. As of 08/18/2011, at the every 6 weeks.      . Lidocaine, Anorectal, 5 % CREA Apply 1 application topically as directed.       . naproxen (NAPROSYN) 500 MG tablet Take 1 tablet (500 mg total) by mouth 2 (two) times daily.  60 tablet  0  . oxyCODONE-acetaminophen (PERCOCET) 5-325 MG per tablet Take 0.5 tablets by mouth every 6 (six) hours as needed. For moderate to severe pain.      . predniSONE (DELTASONE) 10 MG tablet Take 20 mg by mouth daily.          No Known Allergies    Family History  Problem Relation Age of Onset  . Cancer Maternal Aunt     ?  [name] Aneurysm Mother   . Stroke Mother   . Thyroid disease Mother   . Heart disease Mother   . Emphysema Maternal Grandmother   . Colon cancer Neg Hx     History   Social History  . Marital Status: Married    Spouse Name: N/A  Number of Children: 2  . Years of Education: N/A   Occupational History  . unemployed    Social History Main Topics  . Smoking status: Former Smoker    Quit date: 01/26/2009  . Smokeless tobacco: Never Used  . Alcohol Use: No     occasionally  . Drug Use: Yes    Special: Marijuana     occasional  . Sexually Active: None   Other Topics Concern  . None   Social History Narrative  . None      REVIEW OF SYSTEMS - PERTINENT POSITIVES ONLY: Review of Systems - General ROS: negative for - chills, fever or weight loss Respiratory ROS: no cough, shortness of breath, or wheezing Cardiovascular ROS: no chest pain or dyspnea on exertion Gastrointestinal ROS: positive for - abdominal pain, blood in stools, constipation, diarrhea and nausea/vomiting negative for - swallowing difficulty/pain Genito-Urinary ROS: no dysuria, trouble voiding, or hematuria  EXAM: Filed Vitals:   10/05/11 1622  BP: 120/82  Pulse: 72  Temp: 97.7 F (36.5 C)  Resp: 18    General appearance: alert and no distress GI: soft,  non-tender; bowel sounds normal; no masses,  no organomegaly  After the risks and benefits were explained, verbal consent was obtained for an anal exam.  There was a Charity fundraiser in the room during the entire procedure. Anesthesia: none Findings: no signs of abscess, fistula or large fissure, significant pain and sphincter hypertension on DRE.  Exam terminated at that point    ASSESSMENT AND PLAN: This is a 41 y.o. male with Crohns disease who presents to the office from Dr Magnus Ivan with a diagnosis of anal fissure.  He is using the ntg cream and doing sitz baths.  I agree with his diagnosis and have recommended that he continue the ntg cream for a few more weeks.  If he is still having significant pain at that time, I will perform an exam under anesthesia to determine if there is anything occult going on within his anal canal and evaluate his prolapsing tissue further.  I explained to him that the best way to control anorectal disease in Crohns is to keep the entire disease under control.  I encouraged him to continue to work with Dr Leone Payor to find a treatment that controlled his symptoms.  I will continue to follow him in the office and will see him back in 3-4 wks.    Vanita Panda, MD Colon and Rectal Surgery / General Surgery [facility]      Visit Diagnoses: 1. Anal sphincter spasm     Primary Care Physician: Leo Grosser, MD

## 2011-10-27 ENCOUNTER — Encounter (INDEPENDENT_AMBULATORY_CARE_PROVIDER_SITE_OTHER): Payer: Self-pay | Admitting: General Surgery

## 2011-10-27 ENCOUNTER — Ambulatory Visit (INDEPENDENT_AMBULATORY_CARE_PROVIDER_SITE_OTHER): Payer: Medicaid Other | Admitting: General Surgery

## 2011-10-27 VITALS — BP 112/64 | HR 76 | Temp 97.9°F | Resp 16 | Ht 68.0 in | Wt 182.2 lb

## 2011-10-27 DIAGNOSIS — K6289 Other specified diseases of anus and rectum: Secondary | ICD-10-CM

## 2011-10-27 NOTE — Progress Notes (Signed)
Gary Barton is a 41 y.o. male  who presented to the office with anorectal pain. He has a diagnosis of Crohn's disease. Other symptoms include nausea, vomiting, constipation and rectal bleeding. He has tried rectal suppositories, prednisone and remicaide in the past. He is currently on Remicaide. His bowel habits are still somewhat irregular and his bowel movements are sometimes difficult causing straining and prolapsed tissue. His fiber intake is moderate. His last colonoscopy was a year ago.   Objective: Filed Vitals:   10/27/11 1052  BP: 112/64  Pulse: 76  Temp: 97.9 F (36.6 C)  Resp: 16    General appearance: alert, cooperative and no distress GI: soft, mildly tender to palpation Anorectal: no fluctuant masses or fistula openings seen Unable to perform DRE due to pain  Assessment and Plan: This is a 42 y.o. male with Crohns disease who presents to the office from Dr Magnus Ivan with a diagnosis of anal fissure.  I will perform an Exam under anesthesia to rule out any occult abscesses or fistulas.  I do not see any obvious fistulas, but I will be able to look more closely when the patient is asleep.  I suspect that this prolapsing tissue may be hemorrhoids, given his history of straining.  I discussed with him that if this is the case, I would not recommend excision in someone with any active Crohn's disease.       Vanita Panda, MD [facility], [place] [phone]

## 2011-10-27 NOTE — Patient Instructions (Signed)
We will schedule you for an exam under anesthesia and possible seton placement.

## 2011-11-03 ENCOUNTER — Ambulatory Visit (INDEPENDENT_AMBULATORY_CARE_PROVIDER_SITE_OTHER): Payer: Medicaid Other | Admitting: Internal Medicine

## 2011-11-03 ENCOUNTER — Telehealth: Payer: Self-pay

## 2011-11-03 ENCOUNTER — Encounter: Payer: Self-pay | Admitting: Internal Medicine

## 2011-11-03 ENCOUNTER — Other Ambulatory Visit: Payer: Self-pay

## 2011-11-03 VITALS — BP 132/74 | HR 80 | Ht 68.0 in | Wt 184.0 lb

## 2011-11-03 DIAGNOSIS — G8929 Other chronic pain: Secondary | ICD-10-CM

## 2011-11-03 DIAGNOSIS — K6289 Other specified diseases of anus and rectum: Secondary | ICD-10-CM

## 2011-11-03 DIAGNOSIS — K5000 Crohn's disease of small intestine without complications: Secondary | ICD-10-CM

## 2011-11-03 DIAGNOSIS — K219 Gastro-esophageal reflux disease without esophagitis: Secondary | ICD-10-CM

## 2011-11-03 DIAGNOSIS — R112 Nausea with vomiting, unspecified: Secondary | ICD-10-CM

## 2011-11-03 DIAGNOSIS — K5090 Crohn's disease, unspecified, without complications: Secondary | ICD-10-CM

## 2011-11-03 MED ORDER — OMEPRAZOLE 20 MG PO CPDR: 20.0000 mg | DELAYED_RELEASE_CAPSULE | Freq: Every day | ORAL | Status: DC

## 2011-11-03 MED ORDER — PROMETHAZINE HCL 25 MG PO TABS: 25.0000 mg | ORAL_TABLET | Freq: Four times a day (QID) | ORAL | Status: DC | PRN
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
## 2011-11-03 NOTE — Telephone Encounter (Signed)
Patient advised of Remicade infusion for 11/12/11 10:00

## 2011-11-03 NOTE — Patient Instructions (Addendum)
Please pick up your new medications at the pharmacy. Promethazine helps nausea and vomiting - this is an as needed medication  Omeprazole is to be taken every day to block acid and help reflux. We will arrange for your Remicade and call you about next appointment.  I want to see you in 3 months.  Thank you for choosing me and [facility].  Iva Boop, M.D., [facility]

## 2011-11-06 ENCOUNTER — Encounter (HOSPITAL_BASED_OUTPATIENT_CLINIC_OR_DEPARTMENT_OTHER): Payer: Self-pay | Admitting: *Deleted

## 2011-11-06 ENCOUNTER — Other Ambulatory Visit: Payer: Self-pay

## 2011-11-06 DIAGNOSIS — K5090 Crohn's disease, unspecified, without complications: Secondary | ICD-10-CM

## 2011-11-09 ENCOUNTER — Encounter (HOSPITAL_BASED_OUTPATIENT_CLINIC_OR_DEPARTMENT_OTHER): Payer: Self-pay | Admitting: *Deleted

## 2011-11-09 NOTE — Progress Notes (Signed)
NPO AFTER MN. ARRIVES AT 0700. NEEDS HG. WILL TAKE PRILOSEC AM OF SURG W/ SIP OF WATER.

## 2011-11-11 ENCOUNTER — Encounter (HOSPITAL_BASED_OUTPATIENT_CLINIC_OR_DEPARTMENT_OTHER)
Admission: RE | Disposition: A | Discharge status: Home / Self Care | Payer: Self-pay | Source: Ambulatory Visit | Attending: General Surgery

## 2011-11-11 ENCOUNTER — Encounter (HOSPITAL_BASED_OUTPATIENT_CLINIC_OR_DEPARTMENT_OTHER): Payer: Self-pay | Admitting: *Deleted

## 2011-11-11 ENCOUNTER — Encounter (HOSPITAL_BASED_OUTPATIENT_CLINIC_OR_DEPARTMENT_OTHER): Payer: Self-pay | Admitting: Anesthesiology

## 2011-11-11 ENCOUNTER — Ambulatory Visit (HOSPITAL_BASED_OUTPATIENT_CLINIC_OR_DEPARTMENT_OTHER): Payer: Medicaid Other | Admitting: Anesthesiology

## 2011-11-11 ENCOUNTER — Ambulatory Visit (HOSPITAL_BASED_OUTPATIENT_CLINIC_OR_DEPARTMENT_OTHER)
Admission: RE | Admit: 2011-11-11 | Discharge: 2011-11-11 | Disposition: A | Discharge status: Home / Self Care | Payer: Medicaid Other | Source: Ambulatory Visit | Attending: General Surgery | Admitting: General Surgery

## 2011-11-11 DIAGNOSIS — K602 Anal fissure, unspecified: Secondary | ICD-10-CM | POA: Insufficient documentation

## 2011-11-11 DIAGNOSIS — R112 Nausea with vomiting, unspecified: Secondary | ICD-10-CM | POA: Insufficient documentation

## 2011-11-11 DIAGNOSIS — K5900 Constipation, unspecified: Secondary | ICD-10-CM | POA: Insufficient documentation

## 2011-11-11 DIAGNOSIS — K6289 Other specified diseases of anus and rectum: Secondary | ICD-10-CM | POA: Insufficient documentation

## 2011-11-11 DIAGNOSIS — K219 Gastro-esophageal reflux disease without esophagitis: Secondary | ICD-10-CM | POA: Insufficient documentation

## 2011-11-11 DIAGNOSIS — K5090 Crohn's disease, unspecified, without complications: Secondary | ICD-10-CM

## 2011-11-11 DIAGNOSIS — K626 Ulcer of anus and rectum: Secondary | ICD-10-CM

## 2011-11-11 DIAGNOSIS — K625 Hemorrhage of anus and rectum: Secondary | ICD-10-CM | POA: Insufficient documentation

## 2011-11-11 HISTORY — DX: Allergic rhinitis, unspecified: J30.9

## 2011-11-11 HISTORY — DX: Muscle weakness (generalized): M62.81

## 2011-11-11 HISTORY — DX: Headache: R51

## 2011-11-11 HISTORY — DX: Anesthesia of skin: R20.0

## 2011-11-11 HISTORY — DX: Unspecified abdominal pain: R10.9

## 2011-11-11 HISTORY — DX: Anesthesia of skin: R20.2

## 2011-11-11 HISTORY — DX: Nausea: R11.0

## 2011-11-11 HISTORY — DX: Gastro-esophageal reflux disease without esophagitis: K21.9

## 2011-11-11 HISTORY — PX: EXAMINATION UNDER ANESTHESIA: SHX1540

## 2011-11-11 LAB — POCT HEMOGLOBIN-HEMACUE: Hemoglobin: 13.1 g/dL (ref 13.0–17.0)

## 2011-11-11 SURGERY — EXAM UNDER ANESTHESIA
Anesthesia: General | Site: Rectum | Wound class: Contaminated

## 2011-11-11 MED ORDER — ONDANSETRON HCL 4 MG/2ML IJ SOLN: 4.0000 mg | Freq: Four times a day (QID) | INTRAMUSCULAR | Status: DC | PRN

## 2011-11-11 MED ORDER — OXYCODONE HCL 5 MG PO TABS: 5.0000 mg | ORAL_TABLET | Freq: Four times a day (QID) | ORAL | Status: DC | PRN

## 2011-11-11 MED ORDER — ONDANSETRON HCL 4 MG/2ML IJ SOLN
INTRAMUSCULAR | Status: DC | PRN
  Administered 2011-11-11: 4 mg via INTRAVENOUS

## 2011-11-11 MED ORDER — SODIUM CHLORIDE 0.9 % IR SOLN
Status: DC | PRN
  Administered 2011-11-11: 500 mL

## 2011-11-11 MED ORDER — METRONIDAZOLE 500 MG PO TABS: 500.0000 mg | ORAL_TABLET | Freq: Three times a day (TID) | ORAL | Status: DC

## 2011-11-11 MED ORDER — PSYLLIUM 28 % PO PACK: 1.0000 | PACK | Freq: Two times a day (BID) | ORAL | Status: DC

## 2011-11-11 MED ORDER — SUCCINYLCHOLINE CHLORIDE 20 MG/ML IJ SOLN
INTRAMUSCULAR | Status: DC | PRN
  Administered 2011-11-11: 200 mg via INTRAVENOUS

## 2011-11-11 MED ORDER — SODIUM CHLORIDE 0.9 % IJ SOLN: 3.0000 mL | INTRAMUSCULAR | Status: DC | PRN

## 2011-11-11 MED ORDER — ACETAMINOPHEN 325 MG PO TABS: 650.0000 mg | ORAL_TABLET | ORAL | Status: DC | PRN

## 2011-11-11 MED ORDER — BUPIVACAINE-EPINEPHRINE 0.5% -1:200000 IJ SOLN
INTRAMUSCULAR | Status: DC | PRN
  Administered 2011-11-11: 20 mL

## 2011-11-11 MED ORDER — DEXAMETHASONE SODIUM PHOSPHATE 4 MG/ML IJ SOLN
INTRAMUSCULAR | Status: DC | PRN
Start: 2011-11-11 — End: 2011-11-11
  Administered 2011-11-11: 10 mg via INTRAVENOUS

## 2011-11-11 MED ORDER — CIPROFLOXACIN HCL 500 MG PO TABS: 500.0000 mg | ORAL_TABLET | Freq: Two times a day (BID) | ORAL | Status: DC

## 2011-11-11 MED ORDER — ACETAMINOPHEN 650 MG RE SUPP: 650.0000 mg | RECTAL | Status: DC | PRN

## 2011-11-11 MED ORDER — PROMETHAZINE HCL 25 MG/ML IJ SOLN: 6.2500 mg | INTRAMUSCULAR | Status: DC | PRN

## 2011-11-11 MED ORDER — LACTATED RINGERS IV SOLN: INTRAVENOUS | Status: DC

## 2011-11-11 MED ORDER — PROPOFOL 10 MG/ML IV BOLUS
INTRAVENOUS | Status: DC | PRN
  Administered 2011-11-11: 200 mg via INTRAVENOUS

## 2011-11-11 MED ORDER — CIPROFLOXACIN HCL 500 MG PO TABS: 500.0000 mg | ORAL_TABLET | Freq: Two times a day (BID) | ORAL | Status: AC

## 2011-11-11 MED ORDER — FENTANYL CITRATE 0.05 MG/ML IJ SOLN: 25.0000 ug | INTRAMUSCULAR | Status: DC | PRN

## 2011-11-11 MED ORDER — LIDOCAINE HCL (CARDIAC) 20 MG/ML IV SOLN
INTRAVENOUS | Status: DC | PRN
  Administered 2011-11-11: 80 mg via INTRAVENOUS

## 2011-11-11 MED ORDER — DEXTROSE 5 % IV SOLN
2.0000 g | INTRAVENOUS | Status: DC | PRN
  Administered 2011-11-11: 2 g via INTRAVENOUS

## 2011-11-11 MED ORDER — METRONIDAZOLE 500 MG PO TABS: 500.0000 mg | ORAL_TABLET | Freq: Three times a day (TID) | ORAL | Status: AC

## 2011-11-11 MED ORDER — MEPERIDINE HCL 25 MG/ML IJ SOLN: 6.2500 mg | INTRAMUSCULAR | Status: DC | PRN

## 2011-11-11 MED ORDER — SODIUM CHLORIDE 0.9 % IV SOLN: 250.0000 mL | INTRAVENOUS | Status: DC | PRN

## 2011-11-11 MED ORDER — DOCUSATE SODIUM 100 MG PO CAPS: 100.0000 mg | ORAL_CAPSULE | Freq: Two times a day (BID) | ORAL | Status: DC

## 2011-11-11 MED ORDER — SODIUM CHLORIDE 0.9 % IJ SOLN: 3.0000 mL | Freq: Two times a day (BID) | INTRAMUSCULAR | Status: DC

## 2011-11-11 MED ORDER — MIDAZOLAM HCL 5 MG/5ML IJ SOLN
INTRAMUSCULAR | Status: DC | PRN
  Administered 2011-11-11: 2 mg via INTRAVENOUS

## 2011-11-11 MED ORDER — LACTATED RINGERS IV SOLN
INTRAVENOUS | Status: DC
  Administered 2011-11-11 (×3): via INTRAVENOUS

## 2011-11-11 MED ORDER — OXYCODONE HCL 5 MG PO TABS
5.0000 mg | ORAL_TABLET | ORAL | Status: DC | PRN
  Administered 2011-11-11: 5 mg via ORAL

## 2011-11-11 MED ORDER — FENTANYL CITRATE 0.05 MG/ML IJ SOLN
INTRAMUSCULAR | Status: DC | PRN
  Administered 2011-11-11 (×5): 50 ug via INTRAVENOUS

## 2011-11-11 SURGICAL SUPPLY — 34 items
BLADE SURG 15 STRL LF DISP TIS (BLADE) ×1 IMPLANT
BLADE SURG 15 STRL SS (BLADE) ×1
CANISTER SUCTION 2500CC (MISCELLANEOUS) ×2 IMPLANT
CLOTH BEACON ORANGE TIMEOUT ST (SAFETY) ×2 IMPLANT
COVER TABLE BACK 60X90 (DRAPES) ×2 IMPLANT
DRAPE LAPAROTOMY TRNSV 102X78 (DRAPE) ×2 IMPLANT
DRAPE LG THREE QUARTER DISP (DRAPES) ×4 IMPLANT
DRAPE UNDERBUTTOCKS STRL (DRAPE) ×2 IMPLANT
DRSG PAD ABDOMINAL 8X10 ST (GAUZE/BANDAGES/DRESSINGS) ×4 IMPLANT
ELECT REM PT RETURN 9FT ADLT (ELECTROSURGICAL) ×2
ELECTRODE REM PT RTRN 9FT ADLT (ELECTROSURGICAL) ×1 IMPLANT
GAUZE SPONGE 4X4 16PLY XRAY LF (GAUZE/BANDAGES/DRESSINGS) ×2 IMPLANT
GLOVE BIO SURGEON STRL SZ 6.5 (GLOVE) ×4 IMPLANT
GLOVE BIOGEL PI IND STRL 7.0 (GLOVE) ×1 IMPLANT
GLOVE BIOGEL PI INDICATOR 7.0 (GLOVE) ×1
GLOVE ECLIPSE 8.0 STRL XLNG CF (GLOVE) ×2 IMPLANT
GLOVE INDICATOR 8.0 STRL GRN (GLOVE) ×2 IMPLANT
GOWN STRL NON-REIN LRG LVL3 (GOWN DISPOSABLE) ×2 IMPLANT
GOWN STRL REIN XL XLG (GOWN DISPOSABLE) ×4 IMPLANT
LUBRICANT JELLY K Y 4OZ (MISCELLANEOUS) ×2 IMPLANT
NEEDLE HYPO 22GX1.5 SAFETY (NEEDLE) ×2 IMPLANT
NS IRRIG 500ML POUR BTL (IV SOLUTION) ×2 IMPLANT
PACK BASIN DAY SURGERY FS (CUSTOM PROCEDURE TRAY) ×2 IMPLANT
PENCIL BUTTON HOLSTER BLD 10FT (ELECTRODE) ×2 IMPLANT
SPONGE GAUZE 4X4 12PLY (GAUZE/BANDAGES/DRESSINGS) ×2 IMPLANT
SPONGE SURGIFOAM ABS GEL 12-7 (HEMOSTASIS) IMPLANT
SUT CHROMIC 2 0 SH (SUTURE) ×2 IMPLANT
SUT CHROMIC 3 0 SH 27 (SUTURE) ×2 IMPLANT
SYR BULB IRRIGATION 50ML (SYRINGE) ×2 IMPLANT
SYR CONTROL 10ML LL (SYRINGE) ×2 IMPLANT
TOWEL OR 17X24 6PK STRL BLUE (TOWEL DISPOSABLE) ×6 IMPLANT
TRAY DSU PREP LF (CUSTOM PROCEDURE TRAY) ×2 IMPLANT
TUBE CONNECTING 12X1/4 (SUCTIONS) ×2 IMPLANT
YANKAUER SUCT BULB TIP NO VENT (SUCTIONS) ×2 IMPLANT

## 2011-11-11 NOTE — H&P (View-Only) (Signed)
Gary Barton is a 40 y.o. male  who presented to the office with anorectal pain. He has a diagnosis of Crohn's disease. Other symptoms include nausea, vomiting, constipation and rectal bleeding. He has tried rectal suppositories, prednisone and remicaide in the past. He is currently on Remicaide. His bowel habits are still somewhat irregular and his bowel movements are sometimes difficult causing straining and prolapsed tissue. His fiber intake is moderate. His last colonoscopy was a year ago.   Objective: Filed Vitals:   10/27/11 1052  BP: 112/64  Pulse: 76  Temp: 97.9 F (36.6 C)  Resp: 16    General appearance: alert, cooperative and no distress GI: soft, mildly tender to palpation Anorectal: no fluctuant masses or fistula openings seen Unable to perform DRE due to pain  Assessment and Plan: This is a 40 y.o. male with Crohns disease who presents to the office from Dr Blackman with a diagnosis of anal fissure.  I will perform an Exam under anesthesia to rule out any occult abscesses or fistulas.  I do not see any obvious fistulas, but I will be able to look more closely when the patient is asleep.  I suspect that this prolapsing tissue may be hemorrhoids, given his history of straining.  I discussed with him that if this is the case, I would not recommend excision in someone with any active Crohn's disease.       .Lael Wetherbee C Sharece Fleischhacker, MD [facility] [phone]        

## 2011-11-11 NOTE — Interval H&P Note (Signed)
History and Physical Interval Note:  11/11/2011 8:09 AM  Gary Barton  has presented today for surgery, with the diagnosis of crohn's disease with anal pain.  I cannot find a reason for this pain with an office exam, as it is too painful for him to tolerate.  The various methods of treatment have been discussed with the patient and family. After consideration of risks, benefits and other options for treatment, the patient has consented to  Procedure(s) (LRB) with comments: EXAM UNDER ANESTHESIA (N/A) - anal exam under anesthesia possible Seton placement PLACEMENT OF SETON (N/A) as a surgical intervention .  The patient's history has been reviewed, patient examined, no change in status, stable for surgery.  I have reviewed the patient's chart and labs.  Questions were answered to the patient's satisfaction.     Vanita Panda, MD  Colorectal and General Surgery [facility]

## 2011-11-11 NOTE — Anesthesia Postprocedure Evaluation (Signed)
  Anesthesia Post-op Note  Patient: Gary Barton  Procedure(s) Performed: Procedure(s) (LRB): EXAM UNDER ANESTHESIA (N/A)  Patient Location: PACU  Anesthesia Type: General  Level of Consciousness: awake and alert   Airway and Oxygen Therapy: Patient Spontanous Breathing  Post-op Pain: mild  Post-op Assessment: Post-op Vital signs reviewed, Patient's Cardiovascular Status Stable, Respiratory Function Stable, Patent Airway and No signs of Nausea or vomiting  Post-op Vital Signs: stable  Complications: No apparent anesthesia complications

## 2011-11-11 NOTE — Transfer of Care (Signed)
Immediate Anesthesia Transfer of Care Note  Patient: Gary Barton  Procedure(s) Performed: Procedure(s) (LRB): EXAM UNDER ANESTHESIA (N/A)  Patient Location: Patient transported to PACU with oxygen via face mask at 4 Liters / Min  Anesthesia Type: General  Level of Consciousness: awake and alert   Airway & Oxygen Therapy: Patient Spontanous Breathing and Patient connected to face mask oxygen  Post-op Assessment: Report given to PACU RN and Post -op Vital signs reviewed and stable  Post vital signs: Reviewed and stable  Dentition: Teeth and oropharynx remain in pre-op condition  Complications: No apparent anesthesia complications

## 2011-11-11 NOTE — Anesthesia Preprocedure Evaluation (Signed)
Anesthesia Evaluation  Patient identified by MRN, date of birth, ID band Patient awake    Reviewed: Allergy & Precautions, H&P , NPO status , Patient's Chart, lab work & pertinent test results  Airway Mallampati: II TM Distance: >3 FB Neck ROM: Full    Dental No notable dental hx.    Pulmonary neg pulmonary ROS, Current Smoker,  breath sounds clear to auscultation  Pulmonary exam normal       Cardiovascular negative cardio ROS  Rhythm:Regular Rate:Normal     Neuro/Psych negative neurological ROS  negative psych ROS   GI/Hepatic negative GI ROS, Neg liver ROS, GERD-  Poorly Controlled,(+)     substance abuse  alcohol use and marijuana use,   Endo/Other  negative endocrine ROS  Renal/GU negative Renal ROS  negative genitourinary   Musculoskeletal negative musculoskeletal ROS (+)   Abdominal   Peds negative pediatric ROS (+)  Hematology negative hematology ROS (+)   Anesthesia Other Findings   Reproductive/Obstetrics negative OB ROS                           Anesthesia Physical Anesthesia Plan  ASA: II  Anesthesia Plan: General   Post-op Pain Management:    Induction: Intravenous  Airway Management Planned: Oral ETT  Additional Equipment:   Intra-op Plan:   Post-operative Plan: Extubation in OR  Informed Consent: I have reviewed the patients History and Physical, chart, labs and discussed the procedure including the risks, benefits and alternatives for the proposed anesthesia with the patient or authorized representative who has indicated his/her understanding and acceptance.   Dental advisory given  Plan Discussed with: CRNA  Anesthesia Plan Comments:         Anesthesia Quick Evaluation

## 2011-11-11 NOTE — Op Note (Signed)
11/11/2011  9:51 AM  PATIENT:  Gary Barton  41 y.o. male  Patient Care Team: Donita Brooks, MD as PCP - General (Family Medicine) Iva Boop, MD as Consulting Physician (Gastroenterology)  PRE-OPERATIVE DIAGNOSIS:  crohn's with anal pain  POST-OPERATIVE DIAGNOSIS:  Left anterior anal canal submucosal nodule  PROCEDURE:  Procedure(s): EXAM UNDER ANESTHESIA with anal biopsy Rigid Sigmoidoscopy  SURGEON:  Surgeon(s): Romie Levee, MD  ANESTHESIA:   local and general  EBL:  Total I/O In: 1000 [I.V.:1000] Out: -   SPECIMEN:  Source of Specimen:  L anterior anal canal  DISPOSITION OF SPECIMEN:  PATHOLOGY  COUNTS:  YES  PLAN OF CARE: discharge home  PATIENT DISPOSITION:  PACU - hemodynamically stable.  INDICATION: This is a 41yo M with a diagnosis of Crohn's diease and significant abdominal and rectal exam.  So far, Dr Leone Payor and I have not found any significant objective reasons for his pain.  In the office it sounds more like he is having issues with constipation but has been treated for an anal fissure without relief of his pain.  Given these symptoms and the fact that he is undergoing immunosuppression, I felt that an exam under anesthesia would benefit our treatment plan.  The risks and benefits of the procedure were explained.  I told him that if I found anything, it would probably be a fistula and that I would place a seton for that.    OR FINDINGS: Left anterior anal canal submucosal nodule  DESCRIPTION: The patient was brought to the operating room and general anesthesia was smoothly induced. The patient was laid prone on the operating room table and properly padded. His prepped and draped in usual sterile fashion. A surgical timeout was performed and again the correct patient procedure positioning and preoperative antibiotics. SCDs also noted to be in place prior to the initiation of anesthesia.  Began by a visual inspection of the perianal region. There  are no external openings consistent with fistula. There is a left lateral small internal hernia that protrudes slightly outside the anal canal.  On digital rectal exam the entire anal canal felt smooth with no signs of inflammation. There was an area in the left anterior anal canal just past the external sphincter that was hardened underneath the mucosa.  It was decided to biopsy this area. The mucosa was incised distally to this using Bovie electrocautery. The nodule was elevated with a Allis clamp. Electrocautery and sharp dissection were used to remove the nodule. This was sent to pathology for further examination.  Hemostasis was achieved with electrocautery. The mucosa was then closed using a running 3-0 chromic suture. After this was completed a rigid sigmoid was brought onto the field. This was inserted into the rectum to approximately 10 cm. There was no rectal inflammation. There was no anal inflammation. My mucosal closure appeared to be intact.  At this point approximately 20 mL of local anesthesia was used for perianal block. The patient was then awakened per anesthesia and sent to the postanesthesia care unit in stable condition.  All counts are correct operative room staff.

## 2011-11-11 NOTE — Anesthesia Procedure Notes (Addendum)
Procedure Name: Intubation Date/Time: 11/11/2011 8:46 AM Performed by: Fran Lowes Pre-anesthesia Checklist: Patient identified, Emergency Drugs available, Suction available and Patient being monitored Patient Re-evaluated:Patient Re-evaluated prior to inductionOxygen Delivery Method: Circle System Utilized Preoxygenation: Pre-oxygenation with 100% oxygen Intubation Type: IV induction and Cricoid Pressure applied Ventilation: Mask ventilation without difficulty Laryngoscope Size: Mac and 4 Grade View: Grade I Tube type: Oral Number of attempts: 1 Airway Equipment and Method: stylet,  oral airway and LTA kit utilized Placement Confirmation: ETT inserted through vocal cords under direct vision,  positive ETCO2 and breath sounds checked- equal and bilateral Secured at: 22 cm Tube secured with: Tape Dental Injury: Teeth and Oropharynx as per pre-operative assessment

## 2011-11-12 ENCOUNTER — Encounter (HOSPITAL_COMMUNITY): Admission: RE | Admit: 2011-11-12 | Payer: Medicaid Other | Source: Ambulatory Visit

## 2011-11-12 ENCOUNTER — Telehealth: Payer: Self-pay

## 2011-11-12 ENCOUNTER — Encounter: Payer: Self-pay | Admitting: Internal Medicine

## 2011-11-12 ENCOUNTER — Encounter (HOSPITAL_BASED_OUTPATIENT_CLINIC_OR_DEPARTMENT_OTHER): Payer: Self-pay | Admitting: General Surgery

## 2011-11-12 NOTE — Telephone Encounter (Signed)
Patient just didn't feel up to going today.  He was not advised by Dr. Maisie Fus not to have Remicade.  I have rescheduled him for 11/18/11 10:30.  I have stressed to the patient the importance of letting us know if he needs to reschedule or cancel his Remicade infusions.

## 2011-11-12 NOTE — Telephone Encounter (Signed)
Left message for patient to call back  

## 2011-11-12 NOTE — Telephone Encounter (Signed)
Patient did not show for Remicade today.  He did have an exam under anesthesia yesterday by Dr. Maisie Fus from [facility].  Looks like she did do a bx.  Dr. Leone Payor please advise next step.

## 2011-11-12 NOTE — Telephone Encounter (Signed)
Please call him and find out what is going on

## 2011-11-12 NOTE — Telephone Encounter (Signed)
Ok I have created a warning letter

## 2011-11-13 NOTE — Telephone Encounter (Signed)
Letter mailed

## 2011-11-18 ENCOUNTER — Encounter (HOSPITAL_COMMUNITY)
Admission: RE | Admit: 2011-11-18 | Discharge: 2011-11-18 | Disposition: A | Discharge status: Home / Self Care | Payer: Medicaid Other | Source: Ambulatory Visit | Attending: Internal Medicine | Admitting: Internal Medicine

## 2011-11-18 ENCOUNTER — Encounter (HOSPITAL_COMMUNITY): Payer: Self-pay

## 2011-11-18 VITALS — BP 148/72 | HR 66 | Temp 97.1°F | Resp 16 | Wt 186.0 lb

## 2011-11-18 DIAGNOSIS — K5090 Crohn's disease, unspecified, without complications: Secondary | ICD-10-CM | POA: Insufficient documentation

## 2011-11-18 MED ORDER — SODIUM CHLORIDE 0.9 % IV SOLN
INTRAVENOUS | Status: DC
  Administered 2011-11-18: 10:00:00 via INTRAVENOUS

## 2011-11-18 MED ORDER — ACETAMINOPHEN 325 MG PO TABS
650.0000 mg | ORAL_TABLET | Freq: Every day | ORAL | Status: DC
  Administered 2011-11-18: 650 mg via ORAL
  Filled 2011-11-18: qty 2

## 2011-11-18 MED ORDER — DIPHENHYDRAMINE HCL 25 MG PO TABS
50.0000 mg | ORAL_TABLET | Freq: Every day | ORAL | Status: DC
  Administered 2011-11-18: 50 mg via ORAL
  Filled 2011-11-18 (×3): qty 2

## 2011-11-18 MED ORDER — SODIUM CHLORIDE 0.9 % IV SOLN
5.0000 mg/kg | INTRAVENOUS | Status: DC
  Administered 2011-11-18: 400 mg via INTRAVENOUS
  Filled 2011-11-18: qty 40

## 2011-11-20 IMAGING — CT CT ABD-PELV W/ CM
2 of 4 series · 17 of 46 positions shown, 19 images · IV contrast (APPLIED)
Comparison: 05/26/2007

CLINICAL DATA: Abdominal pain, vomiting

CT ABDOMEN AND PELVIS WITH CONTRAST
TECHNIQUE: Multidetector CT imaging of the abdomen and pelvis was
performed following the standard protocol during bolus
administration of intravenous contrast.
Contrast: 125 ml Zmnipaque-KAA

[Series 2: abd_pel 5.0 b40f st · axial · 0.69mm/px · z∈[-486,-76]mm · 14 of 90 slices shown, 16 images]
[im 4/90  soft-tissue]
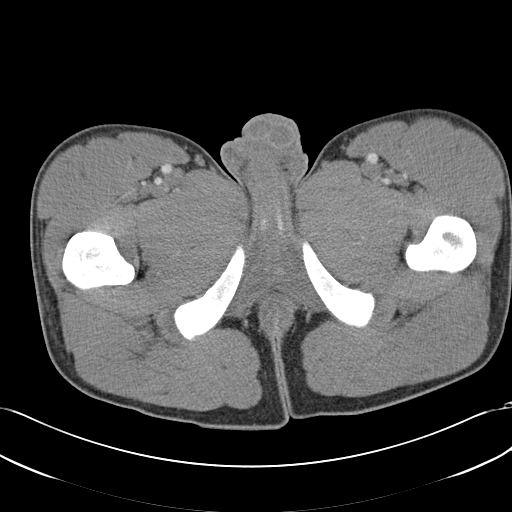
[im 4/90  bone]
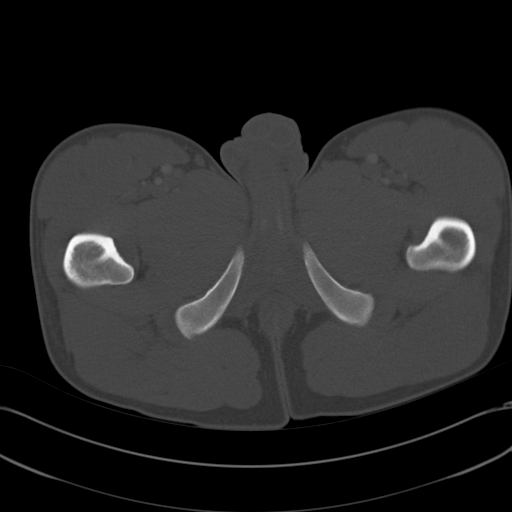
[im 11/90  soft-tissue]
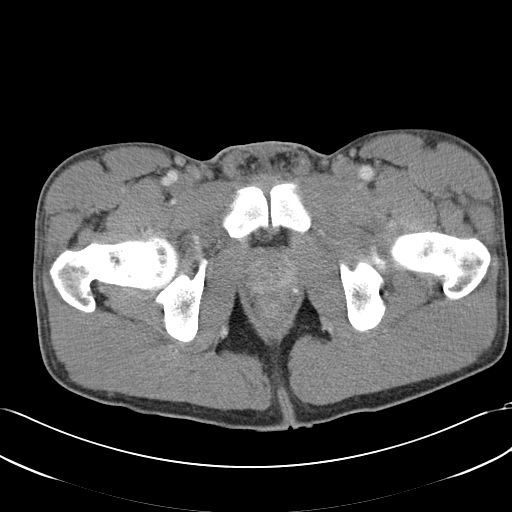
[im 18/90  soft-tissue]
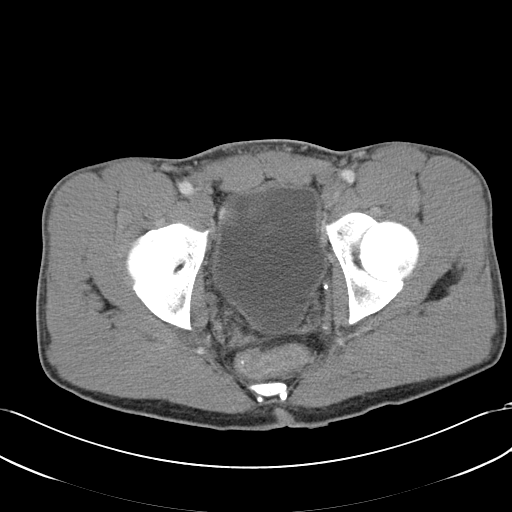
[im 24/90  soft-tissue]
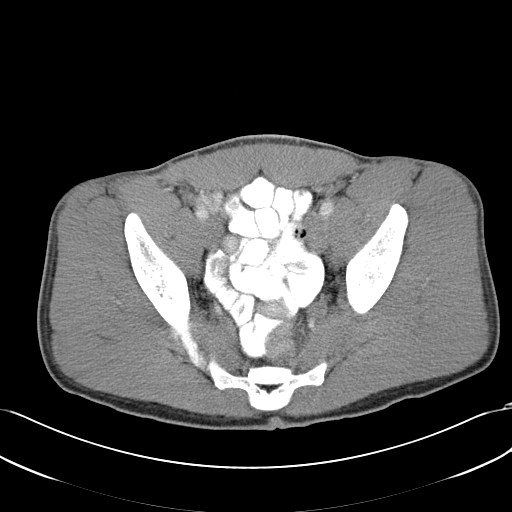
[im 31/90  soft-tissue]
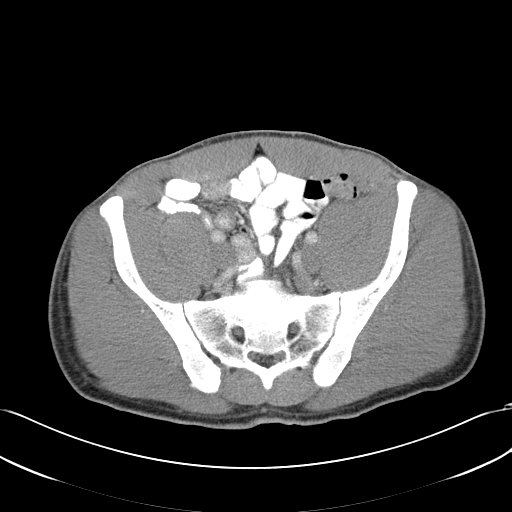
[im 35/90  soft-tissue]
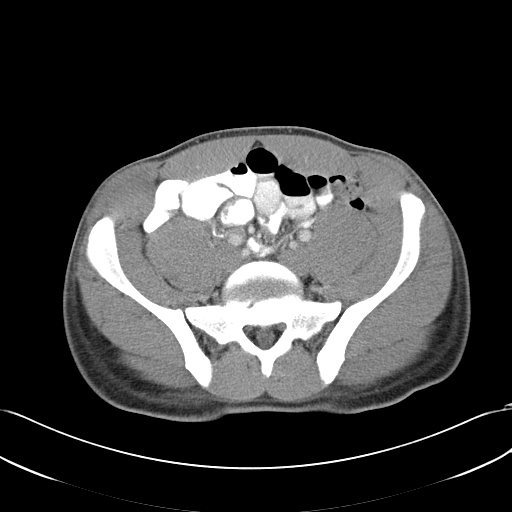
[im 42/90  soft-tissue]
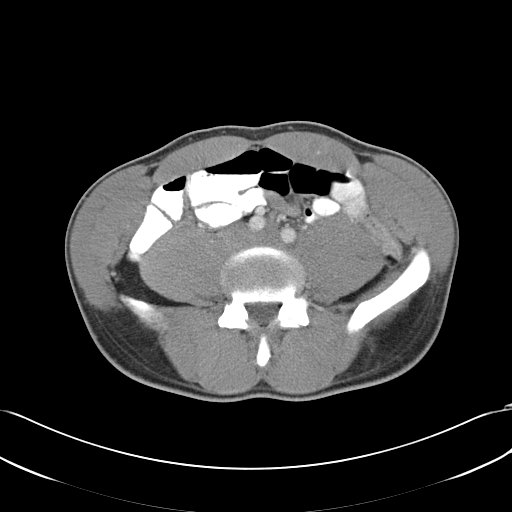
[im 48/90  soft-tissue]
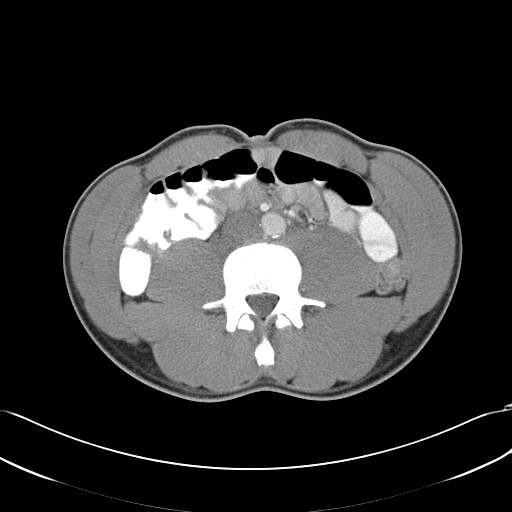
[im 55/90  soft-tissue]
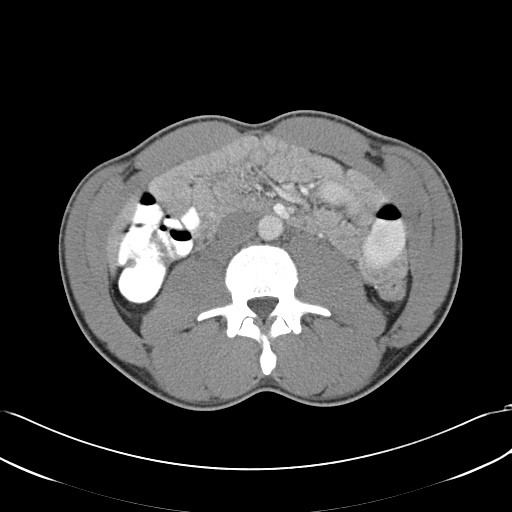
[im 55/90  bone]
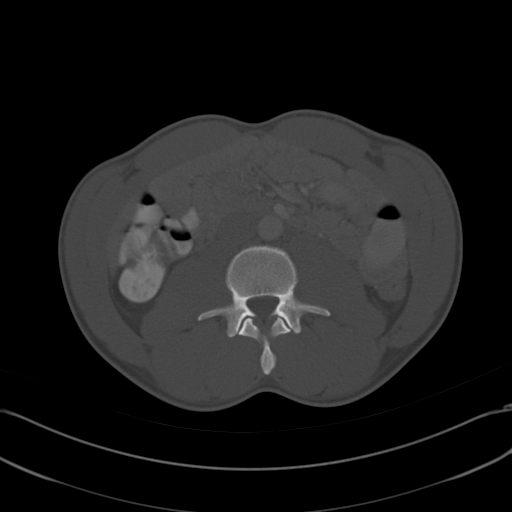
[im 59/90  soft-tissue]
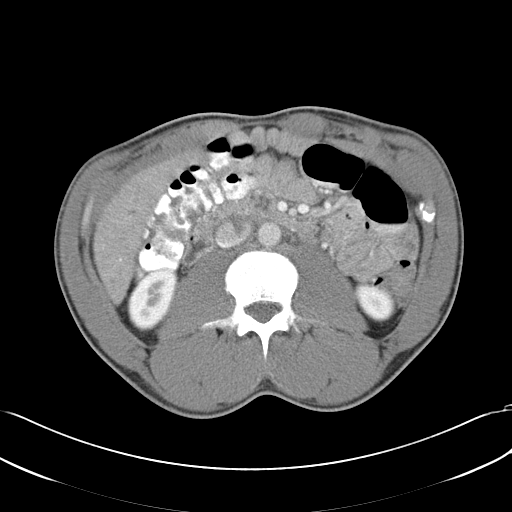
[im 66/90  soft-tissue]
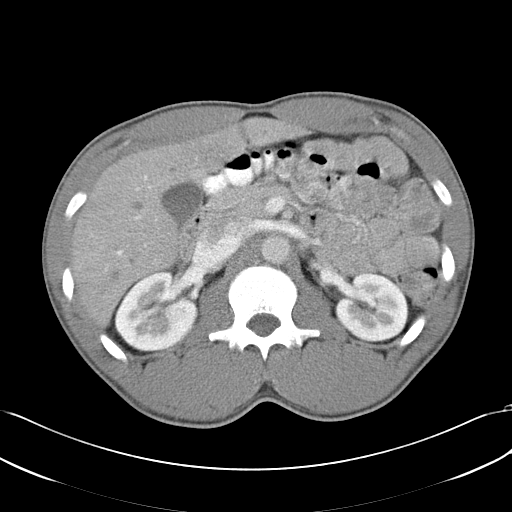
[im 72/90  soft-tissue]
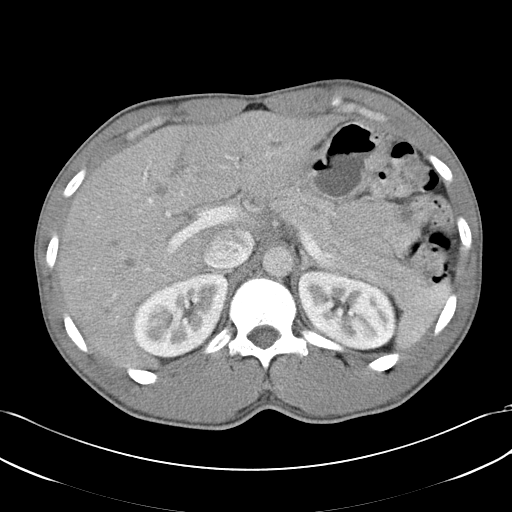
[im 79/90  soft-tissue]
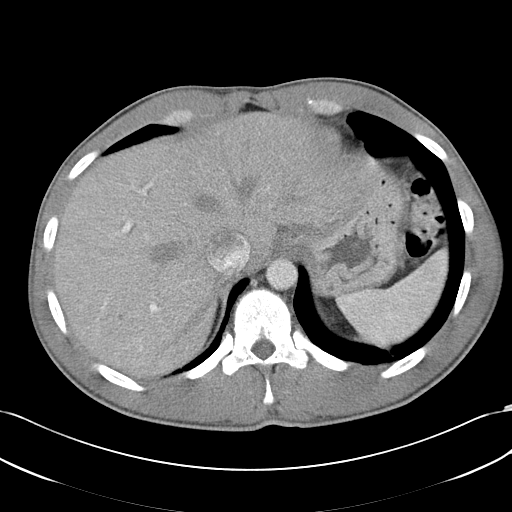
[im 86/90  soft-tissue]
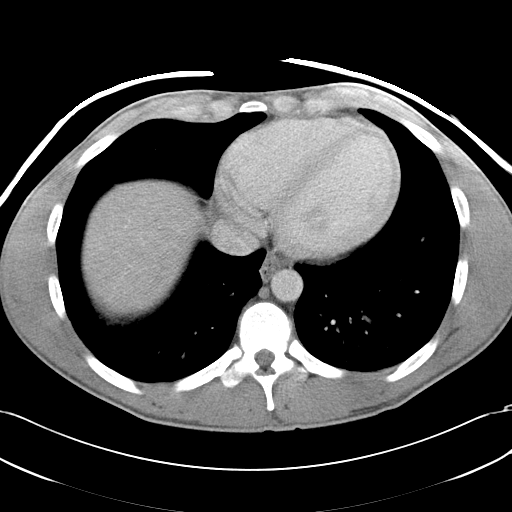

[Series 602: coronal abdomen · coronal · 0.91mm/px · 3 of 113 slices shown]
[im 38/113  soft-tissue]
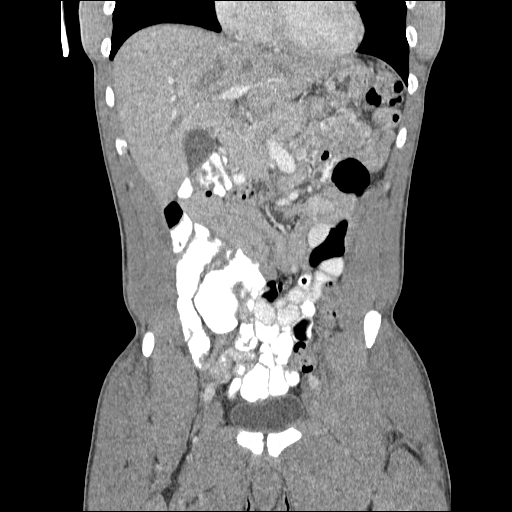
[im 50/113  soft-tissue]
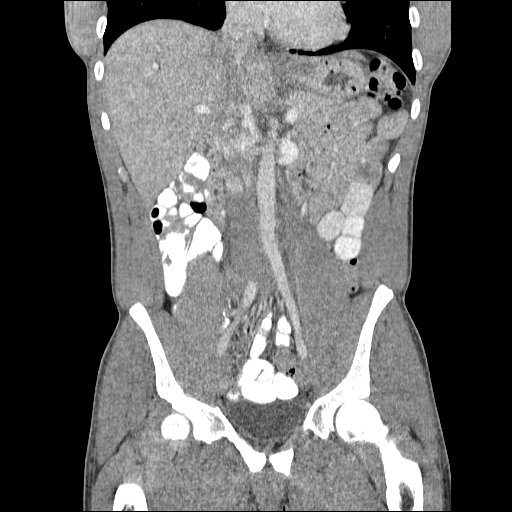
[im 63/113  soft-tissue]
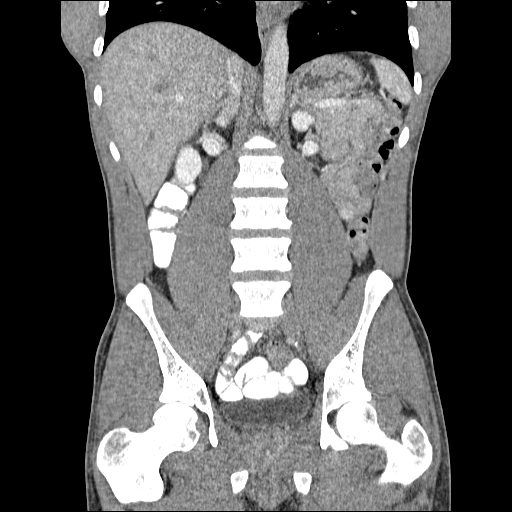

[17 of 46 positions shown; findings below may reference images not displayed]

FINDINGS: Minimal basilar atelectasis.  Normal heart size.  No
pericardial or pleural effusion.

Abdomen:  Liver demonstrates unopacified hepatic veins because of
the early portal phase of imaging.  No biliary dilatation or focal
hepatic abnormality.  Gallbladder, biliary system, pancreas,
spleen, adrenal glands, and right kidney are within normal limits.
Left kidney demonstrates no acute process but has a lower pole 6 mm
nonobstructing renal calculus image 28.  No bowel obstruction
pattern, dilatation, ileus, free air.  Examination of the bowel is
limited  because of the lack of intra-abdominal fat.  No free
fluid, fluid collection, hemorrhage, or abnormal adenopathy.

Pelvis:  Portions of the appendix are visualized and unremarkable.
No pelvic free fluid, fluid collection, hemorrhage, hematoma,
abnormal adenopathy, or inguinal abnormality.
IMPRESSION: No acute intra-abdominal or pelvic process demonstrated.
Nonobstructing left lower pole renal calculus.

## 2011-11-24 ENCOUNTER — Encounter (INDEPENDENT_AMBULATORY_CARE_PROVIDER_SITE_OTHER): Payer: Self-pay | Admitting: General Surgery

## 2011-11-24 ENCOUNTER — Ambulatory Visit (INDEPENDENT_AMBULATORY_CARE_PROVIDER_SITE_OTHER): Payer: Medicaid Other | Admitting: General Surgery

## 2011-11-24 VITALS — BP 100/70 | HR 96 | Temp 98.0°F | Resp 18 | Ht 68.0 in | Wt 182.4 lb

## 2011-11-24 DIAGNOSIS — K626 Ulcer of anus and rectum: Secondary | ICD-10-CM

## 2011-11-24 MED ORDER — LIDOCAINE 5 % EX OINT: TOPICAL_OINTMENT | CUTANEOUS | Status: DC | PRN

## 2011-11-24 NOTE — Patient Instructions (Signed)
Continue fiber supplements.  Use the ointment on your rectal area prior to bowel movements.  Take a warm bath after bowel movements.  You may use the ointment throughout the day to control your pain.

## 2011-11-24 NOTE — Progress Notes (Signed)
Gary Barton is a 41 y.o. male who is status post a EUA and biopsy on 10/16.  He says his pain with bowel movements is about the same.  His pain is most intense with bowel movements.  Objective: Filed Vitals:   11/24/11 1205  BP: 100/70  Pulse: 96  Temp: 98 F (36.7 C)  Resp: 18    General appearance: alert, cooperative and no distress GI: soft, non-tender; bowel sounds normal; no masses,  no organomegaly Rectal: moderate sized anterior anal ulcer  Incision: slow healing  Rectal mass Pathology Diagnosis Anus, biopsy, anterior canal - ULCERATION WITH INFLAMMATION AND FIBROSIS. - NO EVIDENCE OF MALIGNANCY IDENTIFIED.  Assessment: s/p  Patient Active Problem List  Diagnosis  . Crohn's ileitis   . GERD (gastroesophageal reflux disease)  . Hemorrhoids, internal, with bleeding  . Cough  . Anal fissure    Plan: He does appear to have anorectal crohn's disease.  He is currently on Remicaid.  His symptoms are relatively unchanged but he is gaining weight.  He has an anterior anal ulceration.  I have recommended that he continue his fiber supplements and use lidocaine ointment prior to bowel movements and sitz baths afterwards.  I will see him back in 4 weeks or sooner if needed.   Vanita Panda, MD [facility], [place] [phone]   11/24/2011 12:33 PM

## 2011-11-27 ENCOUNTER — Telehealth: Payer: Self-pay

## 2011-11-27 DIAGNOSIS — K5090 Crohn's disease, unspecified, without complications: Secondary | ICD-10-CM

## 2011-11-27 NOTE — Telephone Encounter (Signed)
The 6 MP should not be a problem for his surgery but we could certainly let his ortho MD know

## 2011-11-27 NOTE — Telephone Encounter (Signed)
Patient aware of Dr. Marvell Fuller recommendations.  He will come Monday for the lab.  He is scheduled for a follow up for 12/08/11.  He is having another orthopedic surgery on 12/18/11 by Dr. Madilyn Fireman to place a screw in his foot. His remicade is scheduled for 12/30/11.  Will it be ok to start on if he is able to prior to his surgery?  Do I need to notify his ortho?

## 2011-11-27 NOTE — Telephone Encounter (Signed)
Message copied by Annett Fabian on Fri Nov 27, 2011 11:40 AM ------      Message from: Stan Head E      Created: Thu Nov 26, 2011  3:20 PM      Regarding: labs needed       I am considering starting            He needs CBC, CMET            Also needs the [facility] TPMT enzyme activity test            Explain to him I am working with Dr. Maisie Fus on this - we are trying to maximize his therapy and i may need to add the 6 MP and/or increase Remicade                  He will need a mid Nov  with me also

## 2011-11-27 NOTE — Telephone Encounter (Signed)
I will notify them once he is started on , he will come for TPMT enzyme activity on Monday

## 2011-11-30 ENCOUNTER — Other Ambulatory Visit (INDEPENDENT_AMBULATORY_CARE_PROVIDER_SITE_OTHER): Payer: Medicaid Other

## 2011-11-30 ENCOUNTER — Other Ambulatory Visit: Payer: Self-pay | Admitting: Internal Medicine

## 2011-11-30 DIAGNOSIS — K5090 Crohn's disease, unspecified, without complications: Secondary | ICD-10-CM

## 2011-11-30 LAB — CBC WITH DIFFERENTIAL/PLATELET
Basophils Absolute: 0.1 10*3/uL (ref 0.0–0.1)
Eosinophils Relative: 2 % (ref 0.0–5.0)
HCT: 41.6 % (ref 39.0–52.0)
Hemoglobin: 13.7 g/dL (ref 13.0–17.0)
Lymphs Abs: 2.6 10*3/uL (ref 0.7–4.0)
MCV: 95.7 fl (ref 78.0–100.0)
Monocytes Absolute: 0.8 10*3/uL (ref 0.1–1.0)
Monocytes Relative: 10.5 % (ref 3.0–12.0)
Neutro Abs: 4.1 10*3/uL (ref 1.4–7.7)
Platelets: 230 10*3/uL (ref 150.0–400.0)
RDW: 14.1 % (ref 11.5–14.6)

## 2011-11-30 LAB — HEPATIC FUNCTION PANEL
ALT: 26 U/L (ref 0–53)
AST: 32 U/L (ref 0–37)
Albumin: 4.4 g/dL (ref 3.5–5.2)
Total Bilirubin: 0.5 mg/dL (ref 0.3–1.2)

## 2011-12-08 ENCOUNTER — Encounter: Payer: Self-pay | Admitting: Internal Medicine

## 2011-12-08 ENCOUNTER — Other Ambulatory Visit: Payer: Medicaid Other

## 2011-12-08 ENCOUNTER — Ambulatory Visit (INDEPENDENT_AMBULATORY_CARE_PROVIDER_SITE_OTHER): Payer: Medicaid Other | Admitting: Internal Medicine

## 2011-12-08 VITALS — BP 110/60 | HR 60 | Ht 68.0 in | Wt 181.8 lb

## 2011-12-08 DIAGNOSIS — K5000 Crohn's disease of small intestine without complications: Secondary | ICD-10-CM

## 2011-12-08 DIAGNOSIS — K602 Anal fissure, unspecified: Secondary | ICD-10-CM

## 2011-12-08 DIAGNOSIS — Z79899 Other long term (current) drug therapy: Secondary | ICD-10-CM

## 2011-12-08 NOTE — Patient Instructions (Addendum)
Your physician has requested that you go to the basement for lab work today.  Take Miralax twice a day and may increase if needed.  We will be in touch with plans once your labs have all come back.  Thank you for choosing me and [facility].  Iva Boop, M.D., [facility]

## 2011-12-08 NOTE — Progress Notes (Signed)
  Subjective:    Patient ID: Gary Barton, male    DOB: 1970/06/11, 41 y.o.   MRN: [id number]  HPI The patient returns with his wife, for followup of Crohn's ileitis. He underwent an exam under anesthesia and was thought to have an anal fissure. He had a nodular area that was biopsied that showed scarring. He now has an ulcer in that area according to Dr. Maisie Fus, the colorectal surgeon. He is struggling with firm bowel movements that are hard, and he has painful defecation. He is using sitz baths. There is occasional rectal bleeding. He has some lower abdominal pain but that seems better overall. He has been compliant with Remicade, 5 mg per kilogram every 6 weeks. Medications, allergies, past medical history, past surgical history, family history and social history are reviewed and updated in the EMR.   Review of Systems He is having some left buttock pain radiating down the back of the thigh He is going to have ankle surgery, but has to travel to [facility] for the surgery though he is seeing an orthopedist here in [place]. Dr. Madilyn Fireman? spelling    Objective:   Physical Exam General:  NAD Eyes:   anicteric Lungs: clear Heart:  S1S2 no rubs, murmurs or gallops Abdomen:  soft and nontender, BS+ Rectal: I see no perianal problems. He is mildly tender to palpation to the left of the anus. Digital exam is not performed. Ext:   no edema    Data Reviewed:  Colo-rectal surgery notes an operative report reviewed.     Assessment & Plan:   1. Crohn's ileitis   2. Anal fissure - question perianal Crohn's disease also   3. Long-term use of immunosuppressant medication    He seems improved overall, to me. He is having hard stools, which could be related to Crohn's disease though that is atypical. It is always been hard to read his symptoms and signs and they're causes, I think he has IBS and Crohn's disease.  1. Add MiraLax 2 doses daily to his stool softener and fiber  supplement. 2. Awaiting results of his TPM T.methyltransferase results - in anticipation of possible addition of 6-MP or azathioprine to his regimen. 3. Also consider increasing his Remicade to 10 mg per kilogram but we'll check Remicade antibodies first. 4. All to be determined pending these results and therapy recommendations.  CC: Romie Levee M.D.Leo Grosser, MD

## 2011-12-11 ENCOUNTER — Other Ambulatory Visit: Payer: Self-pay

## 2011-12-12 LAB — INFLIXIMAB+AB (SERIAL MONITOR)
Anti-Infliximab Antibody: <22 ng/mL
Infliximab Drug Level: 31 ug/mL

## 2011-12-14 NOTE — Progress Notes (Signed)
Quick Note:  No resistance to remicade Ask him if increased MiraLax is helping ______

## 2011-12-16 NOTE — Progress Notes (Signed)
Quick Note:  Please schedule colonoscopy in [facility] to evaluate Crohn's and assess response to treatment ______

## 2011-12-22 ENCOUNTER — Encounter (INDEPENDENT_AMBULATORY_CARE_PROVIDER_SITE_OTHER): Payer: Medicaid Other | Admitting: General Surgery

## 2011-12-29 ENCOUNTER — Other Ambulatory Visit: Payer: Self-pay

## 2011-12-29 DIAGNOSIS — K5010 Crohn's disease of large intestine without complications: Secondary | ICD-10-CM

## 2011-12-30 ENCOUNTER — Ambulatory Visit (AMBULATORY_SURGERY_CENTER): Payer: Medicaid Other | Admitting: *Deleted

## 2011-12-30 ENCOUNTER — Encounter (HOSPITAL_COMMUNITY): Admission: RE | Admit: 2011-12-30 | Payer: Medicaid Other | Source: Ambulatory Visit

## 2011-12-30 VITALS — Ht 68.0 in | Wt 181.0 lb

## 2011-12-30 DIAGNOSIS — K602 Anal fissure, unspecified: Secondary | ICD-10-CM

## 2011-12-30 DIAGNOSIS — K5000 Crohn's disease of small intestine without complications: Secondary | ICD-10-CM

## 2011-12-30 MED ORDER — NA SULFATE-K SULFATE-MG SULF 17.5-3.13-1.6 GM/177ML PO SOLN: ORAL | Status: DC

## 2012-01-05 ENCOUNTER — Encounter: Payer: Self-pay | Admitting: Internal Medicine

## 2012-01-05 ENCOUNTER — Encounter (HOSPITAL_COMMUNITY): Payer: Self-pay

## 2012-01-06 ENCOUNTER — Encounter: Payer: Self-pay | Admitting: Internal Medicine

## 2012-01-06 ENCOUNTER — Ambulatory Visit (AMBULATORY_SURGERY_CENTER): Payer: Medicaid Other | Admitting: Internal Medicine

## 2012-01-06 VITALS — BP 124/69 | HR 53 | Temp 97.4°F | Resp 19 | Ht 68.0 in | Wt 181.0 lb

## 2012-01-06 DIAGNOSIS — K5000 Crohn's disease of small intestine without complications: Secondary | ICD-10-CM

## 2012-01-06 DIAGNOSIS — D1330 Benign neoplasm of unspecified part of small intestine: Secondary | ICD-10-CM

## 2012-01-06 DIAGNOSIS — K602 Anal fissure, unspecified: Secondary | ICD-10-CM

## 2012-01-06 DIAGNOSIS — K633 Ulcer of intestine: Secondary | ICD-10-CM

## 2012-01-06 DIAGNOSIS — K626 Ulcer of anus and rectum: Secondary | ICD-10-CM

## 2012-01-06 MED ORDER — OXYCODONE-ACETAMINOPHEN 10-325 MG PO TABS: 1.0000 | ORAL_TABLET | ORAL | Status: DC | PRN

## 2012-01-06 MED ORDER — SODIUM CHLORIDE 0.9 % IV SOLN: 500.0000 mL | INTRAVENOUS | Status: DC

## 2012-01-06 NOTE — Op Note (Signed)
[facility] [address] [place], [address]   COLONOSCOPY PROCEDURE REPORT  PATIENT: Gary Barton, Gary Barton.  MR#: [id number] BIRTHDATE: 1970-03-10 , 40  yrs. old GENDER: Male ENDOSCOPIST: Iva Boop, MD, [facility] PROCEDURE DATE:  01/06/2012 PROCEDURE:   Colonoscopy with biopsy ASA CLASS:   Class II INDICATIONS:previously diagnosed Crohn's disease: small intestine.  MEDICATIONS: propofol (Diprivan) 500mg  IV, MAC sedation, administered by CRNA, and These medications were titrated to patient response per physician's verbal order  DESCRIPTION OF PROCEDURE:   After the risks benefits and alternatives of the procedure were thoroughly explained, informed consent was obtained.  A digital rectal exam revealed no abnormalities of the rectum and A digital rectal exam revealed the prostate was not enlarged.   The LB [id number]  endoscope was introduced through the anus and advanced to the terminal ileum which was intubated for a short distance. No adverse events experienced.   The quality of the prep was Suprep excellent  The instrument was then slowly withdrawn as the colon was fully examined.      COLON FINDINGS: Mild erythematous was found in the terminal ileum. Multiple biopsies were performed using cold forceps.   Mild erythematous was found in the sigmoid colon.  Multiple biopsies were performed using cold forceps.   A small ulcer was found in the ascending colon.  Multiple biopsies were performed using cold forceps.   A small post-op ulcer was found in the rectum.  Multiple biopsies were performed using cold forceps.   Small internal hemorrhoids were found.   The colon mucosa was otherwise normal. Retroflexed views revealed internal hemorrhoids. The time to cecum=2 minutes 22 seconds.  Withdrawal time=14 minutes 12 seconds. The scope was withdrawn and the procedure completed. COMPLICATIONS: There were no complications.  ENDOSCOPIC IMPRESSION: 1.   Mild  erythematous was found in the terminal ileum; multiple biopsies were performed using cold forceps 2.   Mild erythematous was found in the sigmoid colon; multiple biopsies were performed using cold forceps 3.   Small ulcer in the ascending colon; multiple biopsies were performed using cold forceps 4.   Small ulcer in the rectum; multiple biopsies were performed using cold forceps 5.   Small internal hemorrhoids 6.   The colon mucosa was otherwise normal - excllent prep  RECOMMENDATIONS: Await pathology results - I do think some of these findings could be prep related Stay on Remicade, needs to reschedule 12/4 appointment eSigned:  Iva Boop, MD, [facility] 01/06/2012 3:54 PM   cc: The Patient    Romie Levee, MD

## 2012-01-06 NOTE — Progress Notes (Signed)
Called to room to assist during endoscopic procedure.  Patient ID and intended procedure confirmed with present staff. Received instructions for my participation in the procedure from the performing physician.  

## 2012-01-06 NOTE — Patient Instructions (Addendum)
Overall things looked good. There was possible inflammation in the small and large intestine, nut not much. There was the healing ulcer in the rectum, I biopsied that. You will probably see some bleeding from the rectum - do not be alarmed.  Please be sure to continue the Remicade - I saw that you cancelled Dec 4 appointment.  I will have my staff call about the biopsy results and plans.  Thank you for choosing me and [facility].  Iva Boop, MD, FACG YOU HAD AN ENDOSCOPIC PROCEDURE TODAY AT THE [facility]: Refer to the procedure report that was given to you for any specific questions about what was found during the examination.  If the procedure report does not answer your questions, please call your gastroenterologist to clarify.  If you requested that your care partner not be given the details of your procedure findings, then the procedure report has been included in a sealed envelope for you to review at your convenience later.  YOU SHOULD EXPECT: Some feelings of bloating in the abdomen. Passage of more gas than usual.  Walking can help get rid of the air that was put into your GI tract during the procedure and reduce the bloating. If you had a lower endoscopy (such as a colonoscopy or flexible sigmoidoscopy) you may notice spotting of blood in your stool or on the toilet paper. If you underwent a bowel prep for your procedure, then you may not have a normal bowel movement for a few days.  DIET: Your first meal following the procedure should be a light meal and then it is ok to progress to your normal diet.  A half-sandwich or bowl of soup is an example of a good first meal.  Heavy or fried foods are harder to digest and may make you feel nauseous or bloated.  Likewise meals heavy in dairy and vegetables can cause extra gas to form and this can also increase the bloating.  Drink plenty of fluids but you should avoid alcoholic beverages for 24 hours.  ACTIVITY:  Your care partner should take you home directly after the procedure.  You should plan to take it easy, moving slowly for the rest of the day.  You can resume normal activity the day after the procedure however you should NOT DRIVE or use heavy machinery for 24 hours (because of the sedation medicines used during the test).    SYMPTOMS TO REPORT IMMEDIATELY: A gastroenterologist can be reached at any hour.  During normal business hours, 8:30 AM to 5:00 PM Monday through Friday, call [phone].  After hours and on weekends, please call the GI answering service at [phone] who will take a message and have the physician on call contact you.   Following lower endoscopy (colonoscopy or flexible sigmoidoscopy):  Excessive amounts of blood in the stool  Significant tenderness or worsening of abdominal pains  Swelling of the abdomen that is new, acute  Fever of 100F or higher  Following upper endoscopy (EGD)  Vomiting of blood or coffee ground material  New chest pain or pain under the shoulder blades  Painful or persistently difficult swallowing  New shortness of breath  Fever of 100F or higher  Black, tarry-looking stools  FOLLOW UP: If any biopsies were taken you will be contacted by phone or by letter within the next 1-3 weeks.  Call your gastroenterologist if you have not heard about the biopsies in 3 weeks.  Our staff will call the home  number listed on your records the next business day following your procedure to check on you and address any questions or concerns that you may have at that time regarding the information given to you following your procedure. This is a courtesy call and so if there is no answer at the home number and we have not heard from you through the emergency physician on call, we will assume that you have returned to your regular daily activities without incident.  SIGNATURES/CONFIDENTIALITY: You and/or your care partner have signed paperwork which will be  entered into your electronic medical record.  These signatures attest to the fact that that the information above on your After Visit Summary has been reviewed and is understood.  Full responsibility of the confidentiality of this discharge information lies with you and/or your care-partner.

## 2012-01-06 NOTE — Progress Notes (Signed)
Patient did not experience any of the following events: a burn prior to discharge; a fall within the facility; wrong site/side/patient/procedure/implant event; or a hospital transfer or hospital admission upon discharge from the facility. (G8907) Patient did not have preoperative order for IV antibiotic SSI prophylaxis. (G8918)  

## 2012-01-07 ENCOUNTER — Telehealth: Payer: Self-pay | Admitting: *Deleted

## 2012-01-07 NOTE — Telephone Encounter (Signed)
  Follow up Call-  Call back number 01/06/2012 11/25/2010  Post procedure Call Back phone  # [phone]  Permission to leave phone message Yes -     Patient questions:  Do you have a fever, pain , or abdominal swelling? no Pain Score  0 *  Have you tolerated food without any problems? yes  Have you been able to return to your normal activities? yes  Do you have any questions about your discharge instructions: Diet   no Medications  no Follow up visit  no  Do you have questions or concerns about your Care? no  Actions: * If pain score is 4 or above: No action needed, pain <4.

## 2012-01-12 ENCOUNTER — Encounter (HOSPITAL_COMMUNITY)
Admission: RE | Admit: 2012-01-12 | Discharge: 2012-01-12 | Disposition: A | Discharge status: Home / Self Care | Payer: Medicaid Other | Source: Ambulatory Visit | Attending: Internal Medicine | Admitting: Internal Medicine

## 2012-01-12 ENCOUNTER — Encounter (HOSPITAL_COMMUNITY): Payer: Self-pay

## 2012-01-12 VITALS — BP 69/63 | HR 52 | Temp 97.5°F | Resp 16 | Wt 185.0 lb

## 2012-01-12 DIAGNOSIS — K5090 Crohn's disease, unspecified, without complications: Secondary | ICD-10-CM | POA: Insufficient documentation

## 2012-01-12 MED ORDER — ACETAMINOPHEN 325 MG PO TABS
650.0000 mg | ORAL_TABLET | Freq: Every day | ORAL | Status: DC
  Administered 2012-01-12: 650 mg via ORAL
  Filled 2012-01-12: qty 2

## 2012-01-12 MED ORDER — DIPHENHYDRAMINE HCL 25 MG PO TABS
50.0000 mg | ORAL_TABLET | Freq: Every day | ORAL | Status: DC
  Administered 2012-01-12: 50 mg via ORAL
  Filled 2012-01-12 (×3): qty 2

## 2012-01-12 MED ORDER — SODIUM CHLORIDE 0.9 % IV SOLN
INTRAVENOUS | Status: DC
  Administered 2012-01-12: 12:00:00 via INTRAVENOUS

## 2012-01-12 MED ORDER — DIPHENHYDRAMINE HCL 25 MG PO CAPS: 50.0000 mg | ORAL_CAPSULE | Freq: Every day | ORAL | Status: DC

## 2012-01-12 MED ORDER — SODIUM CHLORIDE 0.9 % IV SOLN
5.0000 mg/kg | INTRAVENOUS | Status: DC
  Administered 2012-01-12: 400 mg via INTRAVENOUS
  Filled 2012-01-12: qty 40

## 2012-01-13 ENCOUNTER — Encounter (HOSPITAL_COMMUNITY): Payer: Medicaid Other

## 2012-01-13 NOTE — Progress Notes (Signed)
Quick Note:  Office  Biopsies show inflammation that are probably from his Crohn's - but if he is taking any ibuprofen or naprosyn, or similar meds could be from that  Please ask if using any NSAID's  If not - need to schedule capsule endoscopy small bowel re: Crohn's and heme + - he did one last year  [facility]  No letter or recall ______

## 2012-01-19 ENCOUNTER — Encounter (INDEPENDENT_AMBULATORY_CARE_PROVIDER_SITE_OTHER): Payer: Self-pay | Admitting: General Surgery

## 2012-01-19 ENCOUNTER — Ambulatory Visit (INDEPENDENT_AMBULATORY_CARE_PROVIDER_SITE_OTHER): Payer: Medicaid Other | Admitting: General Surgery

## 2012-01-19 VITALS — BP 122/76 | HR 68 | Temp 98.2°F | Resp 18 | Ht 69.0 in | Wt 180.0 lb

## 2012-01-19 DIAGNOSIS — K602 Anal fissure, unspecified: Secondary | ICD-10-CM

## 2012-01-19 NOTE — Progress Notes (Signed)
Gary Barton is a 40 y.o. male who is status post a EUA and biopsy on 10/16. He says his pain with bowel movements is getting better. His pain is most intense with bowel movements. He is still doing the warm baths.  He is not taking fiber.  He feels that his abd symptoms are about the same.  Objective:  Filed Vitals:   01/19/12 1030  BP: 122/76  Pulse: 68  Temp: 98.2 F (36.8 C)  Resp: 18    General appearance: alert, cooperative and no distress  GI: soft, non-tender; bowel sounds normal; no masses, no organomegaly  Rectal (assist: Glaspey):  Unable to tolerate anoscope but area feels healed by palpation  Rectal mass Pathology Diagnosis  Anus, biopsy, anterior canal  - ULCERATION WITH INFLAMMATION AND FIBROSIS.  - NO EVIDENCE OF MALIGNANCY IDENTIFIED.   Assessment:  s/p  Patient Active Problem List   Diagnosis   .  Crohn's ileitis   .  GERD (gastroesophageal reflux disease)   .  Hemorrhoids, internal, with bleeding   .  Cough   .  Anal fissure    Plan:  Gary Barton is a 41 y.o. M who is having trouble with rectal pain that is worse with defecation.  He definitely feels as if he has some sphincter relaxation issues and is probably getting fissures from time to time as well.  It does not sound like he has much diarrhea and he is still having trouble with hard stools.  I have recommended that he restart his daily fiber supplement, continue his stool softener BID and add  Miralax if still having hard stools.  I will see him back in 1 month.   Vanita Panda, MD  [facility], [place]  [phone]

## 2012-01-19 NOTE — Patient Instructions (Signed)
Take a fiber supplement daily with plenty of water.  Take Colace 100mg  twice a day  If still having hard stools after doing the above for a couple weeks, add in miralax daily.

## 2012-01-21 ENCOUNTER — Other Ambulatory Visit: Payer: Self-pay

## 2012-01-21 DIAGNOSIS — K5090 Crohn's disease, unspecified, without complications: Secondary | ICD-10-CM

## 2012-02-16 ENCOUNTER — Emergency Department (HOSPITAL_COMMUNITY)
Admission: EM | Admit: 2012-02-16 | Discharge: 2012-02-16 | Disposition: A | Discharge status: Home / Self Care | Payer: Medicaid Other | Attending: Emergency Medicine | Admitting: Emergency Medicine

## 2012-02-16 ENCOUNTER — Emergency Department (HOSPITAL_COMMUNITY): Payer: Medicaid Other

## 2012-02-16 ENCOUNTER — Encounter (HOSPITAL_COMMUNITY): Payer: Self-pay | Admitting: Adult Health

## 2012-02-16 DIAGNOSIS — M79673 Pain in unspecified foot: Secondary | ICD-10-CM

## 2012-02-16 DIAGNOSIS — Z87442 Personal history of urinary calculi: Secondary | ICD-10-CM | POA: Insufficient documentation

## 2012-02-16 DIAGNOSIS — Z87891 Personal history of nicotine dependence: Secondary | ICD-10-CM | POA: Insufficient documentation

## 2012-02-16 DIAGNOSIS — Z8709 Personal history of other diseases of the respiratory system: Secondary | ICD-10-CM | POA: Insufficient documentation

## 2012-02-16 DIAGNOSIS — Z8659 Personal history of other mental and behavioral disorders: Secondary | ICD-10-CM | POA: Insufficient documentation

## 2012-02-16 DIAGNOSIS — Z8679 Personal history of other diseases of the circulatory system: Secondary | ICD-10-CM | POA: Insufficient documentation

## 2012-02-16 DIAGNOSIS — Z8739 Personal history of other diseases of the musculoskeletal system and connective tissue: Secondary | ICD-10-CM | POA: Insufficient documentation

## 2012-02-16 DIAGNOSIS — Z8719 Personal history of other diseases of the digestive system: Secondary | ICD-10-CM | POA: Insufficient documentation

## 2012-02-16 DIAGNOSIS — Z8781 Personal history of (healed) traumatic fracture: Secondary | ICD-10-CM | POA: Insufficient documentation

## 2012-02-16 DIAGNOSIS — Z8669 Personal history of other diseases of the nervous system and sense organs: Secondary | ICD-10-CM | POA: Insufficient documentation

## 2012-02-16 DIAGNOSIS — M79609 Pain in unspecified limb: Secondary | ICD-10-CM | POA: Insufficient documentation

## 2012-02-16 DIAGNOSIS — F1910 Other psychoactive substance abuse, uncomplicated: Secondary | ICD-10-CM | POA: Insufficient documentation

## 2012-02-16 MED ORDER — OXYCODONE-ACETAMINOPHEN 5-325 MG PO TABS: 1.0000 | ORAL_TABLET | Freq: Four times a day (QID) | ORAL | Status: DC | PRN

## 2012-02-16 NOTE — ED Notes (Signed)
Presents with left foot pain ongoing since November post surgery. Pt reports he is unable to stand up with out pain today.

## 2012-02-16 NOTE — Progress Notes (Signed)
Orthopedic Tech Progress Note Patient Details:  Gary Barton 01-21-1971 [id number]  Ortho Devices Type of Ortho Device: Crutches Ortho Device/Splint Interventions: Ordered;Application   Jennye Moccasin 02/16/2012, 9:02 PM
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
## 2012-02-17 ENCOUNTER — Encounter (INDEPENDENT_AMBULATORY_CARE_PROVIDER_SITE_OTHER): Payer: Medicaid Other | Admitting: General Surgery

## 2012-02-19 ENCOUNTER — Encounter (INDEPENDENT_AMBULATORY_CARE_PROVIDER_SITE_OTHER): Payer: Medicaid Other | Admitting: General Surgery

## 2012-02-19 NOTE — ED Provider Notes (Signed)
Medical screening examination/treatment/procedure(s) were performed by non-physician practitioner and as supervising physician I was immediately available for consultation/collaboration.  Raeford Razor, MD 02/19/12 [phone]

## 2012-02-23 ENCOUNTER — Other Ambulatory Visit: Payer: Self-pay

## 2012-02-23 ENCOUNTER — Encounter (HOSPITAL_COMMUNITY): Admission: RE | Admit: 2012-02-23 | Payer: Medicaid Other | Source: Ambulatory Visit

## 2012-02-23 DIAGNOSIS — K5090 Crohn's disease, unspecified, without complications: Secondary | ICD-10-CM

## 2012-02-29 ENCOUNTER — Telehealth: Payer: Self-pay | Admitting: Internal Medicine

## 2012-02-29 DIAGNOSIS — K5000 Crohn's disease of small intestine without complications: Secondary | ICD-10-CM

## 2012-02-29 NOTE — Telephone Encounter (Signed)
Left message for patient to call back  

## 2012-03-02 NOTE — Telephone Encounter (Signed)
Patient is rescheduled for Capsule Endo for 03/08/12 8:00, he has the written instructions at home.  He was a no show for his Remicade on 02/23/12.  I have rescheduled that for 03/10/12 9:15 at [facility].  He is aware of the appts

## 2012-03-07 ENCOUNTER — Telehealth: Payer: Self-pay | Admitting: Internal Medicine

## 2012-03-07 NOTE — Telephone Encounter (Signed)
Left message for patient to call back  

## 2012-03-09 NOTE — Telephone Encounter (Signed)
Patient has no showed and cancelled many times.  No return calls from the patient.  I will await a return call from the patient

## 2012-03-10 ENCOUNTER — Encounter (HOSPITAL_COMMUNITY): Admission: RE | Admit: 2012-03-10 | Payer: Medicaid Other | Source: Ambulatory Visit

## 2012-03-29 ENCOUNTER — Emergency Department (HOSPITAL_COMMUNITY): Payer: Medicaid Other

## 2012-03-29 ENCOUNTER — Emergency Department (HOSPITAL_COMMUNITY)
Admission: EM | Admit: 2012-03-29 | Discharge: 2012-03-29 | Disposition: A | Discharge status: Home / Self Care | Payer: Medicaid Other | Attending: Emergency Medicine | Admitting: Emergency Medicine

## 2012-03-29 ENCOUNTER — Encounter (HOSPITAL_COMMUNITY): Payer: Self-pay | Admitting: Emergency Medicine

## 2012-03-29 DIAGNOSIS — Z8781 Personal history of (healed) traumatic fracture: Secondary | ICD-10-CM | POA: Insufficient documentation

## 2012-03-29 DIAGNOSIS — Z8679 Personal history of other diseases of the circulatory system: Secondary | ICD-10-CM | POA: Insufficient documentation

## 2012-03-29 DIAGNOSIS — S0993XA Unspecified injury of face, initial encounter: Secondary | ICD-10-CM

## 2012-03-29 DIAGNOSIS — Z8719 Personal history of other diseases of the digestive system: Secondary | ICD-10-CM | POA: Insufficient documentation

## 2012-03-29 DIAGNOSIS — Y929 Unspecified place or not applicable: Secondary | ICD-10-CM | POA: Insufficient documentation

## 2012-03-29 DIAGNOSIS — Z8739 Personal history of other diseases of the musculoskeletal system and connective tissue: Secondary | ICD-10-CM | POA: Insufficient documentation

## 2012-03-29 DIAGNOSIS — S0003XA Contusion of scalp, initial encounter: Secondary | ICD-10-CM

## 2012-03-29 DIAGNOSIS — IMO0002 Reserved for concepts with insufficient information to code with codable children: Secondary | ICD-10-CM | POA: Insufficient documentation

## 2012-03-29 DIAGNOSIS — Z87442 Personal history of urinary calculi: Secondary | ICD-10-CM | POA: Insufficient documentation

## 2012-03-29 DIAGNOSIS — Z87891 Personal history of nicotine dependence: Secondary | ICD-10-CM | POA: Insufficient documentation

## 2012-03-29 DIAGNOSIS — Y939 Activity, unspecified: Secondary | ICD-10-CM | POA: Insufficient documentation

## 2012-03-29 DIAGNOSIS — Z8659 Personal history of other mental and behavioral disorders: Secondary | ICD-10-CM | POA: Insufficient documentation

## 2012-03-29 DIAGNOSIS — R404 Transient alteration of awareness: Secondary | ICD-10-CM | POA: Insufficient documentation

## 2012-03-29 DIAGNOSIS — Z8669 Personal history of other diseases of the nervous system and sense organs: Secondary | ICD-10-CM | POA: Insufficient documentation

## 2012-03-29 DIAGNOSIS — F0781 Postconcussional syndrome: Secondary | ICD-10-CM

## 2012-03-29 MED ORDER — OXYCODONE-ACETAMINOPHEN 5-325 MG PO TABS
1.0000 | ORAL_TABLET | Freq: Once | ORAL | Status: AC
  Administered 2012-03-29: 1 via ORAL
  Filled 2012-03-29: qty 1

## 2012-03-29 MED ORDER — OXYCODONE-ACETAMINOPHEN 5-325 MG PO TABS: 1.0000 | ORAL_TABLET | Freq: Four times a day (QID) | ORAL | Status: DC | PRN

## 2012-03-29 MED ORDER — IBUPROFEN 800 MG PO TABS: 800.0000 mg | ORAL_TABLET | Freq: Three times a day (TID) | ORAL | Status: DC

## 2012-03-29 MED ORDER — DIAZEPAM 5 MG PO TABS: 5.0000 mg | ORAL_TABLET | Freq: Two times a day (BID) | ORAL | Status: DC

## 2012-03-29 NOTE — ED Notes (Signed)
Report to [place], [place]
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
## 2012-03-29 NOTE — ED Notes (Signed)
Patient transported to X-ray and CT 
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
## 2012-03-31 ENCOUNTER — Encounter (HOSPITAL_COMMUNITY): Payer: Self-pay

## 2012-03-31 ENCOUNTER — Emergency Department (HOSPITAL_COMMUNITY)
Admission: EM | Admit: 2012-03-31 | Discharge: 2012-03-31 | Disposition: A | Discharge status: Home / Self Care | Payer: Medicaid Other | Attending: Emergency Medicine | Admitting: Emergency Medicine

## 2012-03-31 DIAGNOSIS — Y929 Unspecified place or not applicable: Secondary | ICD-10-CM | POA: Insufficient documentation

## 2012-03-31 DIAGNOSIS — F0781 Postconcussional syndrome: Secondary | ICD-10-CM

## 2012-03-31 DIAGNOSIS — Z79899 Other long term (current) drug therapy: Secondary | ICD-10-CM | POA: Insufficient documentation

## 2012-03-31 DIAGNOSIS — IMO0002 Reserved for concepts with insufficient information to code with codable children: Secondary | ICD-10-CM | POA: Insufficient documentation

## 2012-03-31 DIAGNOSIS — Z8669 Personal history of other diseases of the nervous system and sense organs: Secondary | ICD-10-CM | POA: Insufficient documentation

## 2012-03-31 DIAGNOSIS — Z8659 Personal history of other mental and behavioral disorders: Secondary | ICD-10-CM | POA: Insufficient documentation

## 2012-03-31 DIAGNOSIS — Y939 Activity, unspecified: Secondary | ICD-10-CM | POA: Insufficient documentation

## 2012-03-31 DIAGNOSIS — Z8739 Personal history of other diseases of the musculoskeletal system and connective tissue: Secondary | ICD-10-CM | POA: Insufficient documentation

## 2012-03-31 DIAGNOSIS — R110 Nausea: Secondary | ICD-10-CM | POA: Insufficient documentation

## 2012-03-31 DIAGNOSIS — Z8781 Personal history of (healed) traumatic fracture: Secondary | ICD-10-CM | POA: Insufficient documentation

## 2012-03-31 DIAGNOSIS — Z87442 Personal history of urinary calculi: Secondary | ICD-10-CM | POA: Insufficient documentation

## 2012-03-31 DIAGNOSIS — Z9189 Other specified personal risk factors, not elsewhere classified: Secondary | ICD-10-CM | POA: Insufficient documentation

## 2012-03-31 DIAGNOSIS — K5000 Crohn's disease of small intestine without complications: Secondary | ICD-10-CM | POA: Insufficient documentation

## 2012-03-31 DIAGNOSIS — R42 Dizziness and giddiness: Secondary | ICD-10-CM | POA: Insufficient documentation

## 2012-03-31 DIAGNOSIS — F1910 Other psychoactive substance abuse, uncomplicated: Secondary | ICD-10-CM | POA: Insufficient documentation

## 2012-03-31 DIAGNOSIS — Z8709 Personal history of other diseases of the respiratory system: Secondary | ICD-10-CM | POA: Insufficient documentation

## 2012-03-31 DIAGNOSIS — Z8719 Personal history of other diseases of the digestive system: Secondary | ICD-10-CM | POA: Insufficient documentation

## 2012-03-31 DIAGNOSIS — Z8679 Personal history of other diseases of the circulatory system: Secondary | ICD-10-CM | POA: Insufficient documentation

## 2012-03-31 MED ORDER — OXYCODONE-ACETAMINOPHEN 5-325 MG PO TABS: ORAL_TABLET | ORAL | Status: DC

## 2012-03-31 MED ORDER — OXYCODONE-ACETAMINOPHEN 5-325 MG PO TABS
2.0000 | ORAL_TABLET | Freq: Once | ORAL | Status: AC
  Administered 2012-03-31: 2 via ORAL
  Filled 2012-03-31: qty 2

## 2012-03-31 NOTE — ED Notes (Signed)
Pt states that he would like to be placed back on his medications for depression and anxiety; pt denies thoughts of wanting to hurt self or others; Dr. Oletta Lamas notified about situation and states pt can follow up with his regular doctor which pt has appointment with next week, but if pt needs to speak with him he will come and talk to him prior to d/c.
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
## 2012-03-31 NOTE — ED Notes (Signed)
Pt and wife asked if they would like to see Dr Oletta Lamas before leaving; pt and wife deny wanting to see Dr Oletta Lamas again before leaving; pt denies SI/HI thoughts upon d/c teaching; pt and wife educated on importance of returning to ED if new or worsening symptoms or if pt or pts wife feels unsafe. Pt states he will not be driving and will be following up with doctor. Pt states again that he is no thinking about harming himself or anyone else. Pt alert and mentating appropriately upon d/c and has no further questions upon d/c teaching given.

## 2012-03-31 NOTE — ED Notes (Signed)
Pt endorses that he will not be driving and pt states he will be following up with his regular doctor to get back on his medication.
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
## 2012-04-11 ENCOUNTER — Telehealth: Payer: Self-pay | Admitting: Internal Medicine

## 2012-04-11 NOTE — Telephone Encounter (Signed)
Patient calling to schedule his remicade infusion.  Patient was a no show for the last 2 infusions and is just calling to schedule.  He is advised that he needs an office visit prior to rescheduling Remicade.  He was a no show for capsule endo x2.  Office visit scheduled for 05/03/12

## 2012-04-30 ENCOUNTER — Encounter: Payer: Self-pay | Admitting: Family Medicine

## 2012-05-03 ENCOUNTER — Encounter: Payer: Self-pay | Admitting: Internal Medicine

## 2012-05-03 ENCOUNTER — Ambulatory Visit (INDEPENDENT_AMBULATORY_CARE_PROVIDER_SITE_OTHER): Payer: Medicaid Other | Admitting: Internal Medicine

## 2012-05-03 ENCOUNTER — Ambulatory Visit: Payer: Self-pay | Admitting: Family Medicine

## 2012-05-03 ENCOUNTER — Other Ambulatory Visit (INDEPENDENT_AMBULATORY_CARE_PROVIDER_SITE_OTHER): Payer: Medicaid Other

## 2012-05-03 VITALS — BP 104/70 | HR 92 | Ht 68.0 in | Wt 175.0 lb

## 2012-05-03 DIAGNOSIS — K5000 Crohn's disease of small intestine without complications: Secondary | ICD-10-CM

## 2012-05-03 LAB — CBC WITH DIFFERENTIAL/PLATELET
Basophils Relative: 0.2 % (ref 0.0–3.0)
Eosinophils Absolute: 0.4 10*3/uL (ref 0.0–0.7)
HCT: 38.8 % — ABNORMAL LOW (ref 39.0–52.0)
Hemoglobin: 12.9 g/dL — ABNORMAL LOW (ref 13.0–17.0)
MCHC: 33.2 g/dL (ref 30.0–36.0)
MCV: 94.4 fl (ref 78.0–100.0)
Monocytes Absolute: 0.7 10*3/uL (ref 0.1–1.0)
Neutro Abs: 4.2 10*3/uL (ref 1.4–7.7)
RBC: 4.11 Mil/uL — ABNORMAL LOW (ref 4.22–5.81)

## 2012-05-03 LAB — COMPREHENSIVE METABOLIC PANEL
AST: 26 U/L (ref 0–37)
Alkaline Phosphatase: 86 U/L (ref 39–117)
BUN: 9 mg/dL (ref 6–23)
Creatinine, Ser: 1.4 mg/dL (ref 0.4–1.5)
Potassium: 3.9 mEq/L (ref 3.5–5.1)

## 2012-05-03 NOTE — Patient Instructions (Addendum)
Your physician has requested that you go to the basement for lab work before leaving today.  We will be in contact regarding setting you up for the Remicade Injections to be done every 8 weeks.  Follow up in 2 months.  Thank you for choosing me and [facility].  Iva Boop, M.D., [facility]
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
## 2012-05-03 NOTE — Progress Notes (Signed)
Quick Note:  Labs ok  He needs to be set up for restarting Remicade 10 mg/kg 0,2,6 weeks and very 8 weeks after ______

## 2012-05-04 ENCOUNTER — Other Ambulatory Visit: Payer: Self-pay

## 2012-05-04 DIAGNOSIS — K5090 Crohn's disease, unspecified, without complications: Secondary | ICD-10-CM

## 2012-05-04 NOTE — Progress Notes (Signed)
Quick Note:  Let's get him on for a pneumovax at some point also  ______

## 2012-05-04 NOTE — Progress Notes (Signed)
Quick Note:  Not needed - no antibodies when recently checked ______

## 2012-05-06 ENCOUNTER — Ambulatory Visit (INDEPENDENT_AMBULATORY_CARE_PROVIDER_SITE_OTHER): Payer: Medicaid Other | Admitting: Internal Medicine

## 2012-05-06 ENCOUNTER — Other Ambulatory Visit: Payer: Medicaid Other

## 2012-05-06 DIAGNOSIS — K5090 Crohn's disease, unspecified, without complications: Secondary | ICD-10-CM

## 2012-05-06 DIAGNOSIS — K5000 Crohn's disease of small intestine without complications: Secondary | ICD-10-CM

## 2012-05-09 ENCOUNTER — Other Ambulatory Visit: Payer: Self-pay

## 2012-05-09 DIAGNOSIS — R7611 Nonspecific reaction to tuberculin skin test without active tuberculosis: Secondary | ICD-10-CM

## 2012-05-09 LAB — TB SKIN TEST: TB Skin Test: POSITIVE

## 2012-05-09 NOTE — Progress Notes (Signed)
Quick Note:  Stop remicade PA/Lat CXR re: + PPD ID clinic appointment ______

## 2012-05-17 ENCOUNTER — Ambulatory Visit: Payer: Medicaid Other | Admitting: Infectious Diseases

## 2012-05-19 ENCOUNTER — Encounter (HOSPITAL_COMMUNITY): Payer: Medicaid Other

## 2012-05-28 ENCOUNTER — Emergency Department (HOSPITAL_COMMUNITY)
Admission: EM | Admit: 2012-05-28 | Discharge: 2012-05-28 | Disposition: A | Discharge status: Home / Self Care | Payer: Medicaid Other | Attending: Emergency Medicine | Admitting: Emergency Medicine

## 2012-05-28 ENCOUNTER — Encounter (HOSPITAL_COMMUNITY): Payer: Self-pay | Admitting: Emergency Medicine

## 2012-05-28 ENCOUNTER — Emergency Department (HOSPITAL_COMMUNITY): Payer: Medicaid Other

## 2012-05-28 DIAGNOSIS — M25579 Pain in unspecified ankle and joints of unspecified foot: Secondary | ICD-10-CM | POA: Insufficient documentation

## 2012-05-28 DIAGNOSIS — F341 Dysthymic disorder: Secondary | ICD-10-CM | POA: Insufficient documentation

## 2012-05-28 DIAGNOSIS — M79672 Pain in left foot: Secondary | ICD-10-CM

## 2012-05-28 DIAGNOSIS — F1210 Cannabis abuse, uncomplicated: Secondary | ICD-10-CM | POA: Insufficient documentation

## 2012-05-28 DIAGNOSIS — Z79899 Other long term (current) drug therapy: Secondary | ICD-10-CM | POA: Insufficient documentation

## 2012-05-28 DIAGNOSIS — Z87891 Personal history of nicotine dependence: Secondary | ICD-10-CM | POA: Insufficient documentation

## 2012-05-28 DIAGNOSIS — Z87442 Personal history of urinary calculi: Secondary | ICD-10-CM | POA: Insufficient documentation

## 2012-05-28 DIAGNOSIS — M19049 Primary osteoarthritis, unspecified hand: Secondary | ICD-10-CM | POA: Insufficient documentation

## 2012-05-28 DIAGNOSIS — Z8679 Personal history of other diseases of the circulatory system: Secondary | ICD-10-CM | POA: Insufficient documentation

## 2012-05-28 DIAGNOSIS — G8918 Other acute postprocedural pain: Secondary | ICD-10-CM | POA: Insufficient documentation

## 2012-05-28 LAB — CBC WITH DIFFERENTIAL/PLATELET
Eosinophils Absolute: 0.3 10*3/uL (ref 0.0–0.7)
Hemoglobin: 12.2 g/dL — ABNORMAL LOW (ref 13.0–17.0)
Lymphocytes Relative: 46 % (ref 12–46)
Lymphs Abs: 2.8 10*3/uL (ref 0.7–4.0)
MCH: 31.3 pg (ref 26.0–34.0)
MCV: 90 fL (ref 78.0–100.0)
Monocytes Relative: 11 % (ref 3–12)
Neutrophils Relative %: 38 % — ABNORMAL LOW (ref 43–77)
RBC: 3.9 MIL/uL — ABNORMAL LOW (ref 4.22–5.81)

## 2012-05-28 LAB — BASIC METABOLIC PANEL
BUN: 14 mg/dL (ref 6–23)
Calcium: 9.3 mg/dL (ref 8.4–10.5)
Creatinine, Ser: 1.28 mg/dL (ref 0.50–1.35)
GFR calc non Af Amer: 69 mL/min — ABNORMAL LOW (ref >90)
Glucose, Bld: 92 mg/dL (ref 70–99)
Potassium: 4 mEq/L (ref 3.5–5.1)

## 2012-05-28 MED ORDER — TRAMADOL HCL 50 MG PO TABS: 50.0000 mg | ORAL_TABLET | Freq: Four times a day (QID) | ORAL | Status: DC | PRN

## 2012-05-28 MED ORDER — OXYCODONE-ACETAMINOPHEN 5-325 MG PO TABS
2.0000 | ORAL_TABLET | Freq: Once | ORAL | Status: AC
  Administered 2012-05-28: 2 via ORAL
  Filled 2012-05-28: qty 2

## 2012-05-28 MED ORDER — OXYCODONE-ACETAMINOPHEN 5-325 MG PO TABS: 2.0000 | ORAL_TABLET | Freq: Once | ORAL | Status: DC

## 2012-05-28 NOTE — ED Notes (Signed)
Pt presents to ED with c/o left foot pain. Pt sts he had left foot surgery 2 weeks ago. Pt reports foot is not is healing and has a great deal of pain. NAD.
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
## 2012-06-02 ENCOUNTER — Ambulatory Visit (INDEPENDENT_AMBULATORY_CARE_PROVIDER_SITE_OTHER)
Admission: RE | Admit: 2012-06-02 | Discharge: 2012-06-02 | Disposition: A | Discharge status: Home / Self Care | Payer: Medicaid Other | Source: Ambulatory Visit | Attending: Internal Medicine | Admitting: Internal Medicine

## 2012-06-02 ENCOUNTER — Encounter (HOSPITAL_COMMUNITY): Payer: Medicaid Other

## 2012-06-02 DIAGNOSIS — R7611 Nonspecific reaction to tuberculin skin test without active tuberculosis: Secondary | ICD-10-CM

## 2012-06-03 NOTE — Progress Notes (Signed)
Quick Note:  CXR is ok Has he been to ID clinic? ______

## 2012-06-06 NOTE — Progress Notes (Signed)
Quick Note:  We need to call/send letter about getting there....advise he needs to have this addressed as is potentially a life threatening problem for him  ______

## 2012-06-23 ENCOUNTER — Encounter (HOSPITAL_COMMUNITY): Payer: Medicaid Other

## 2012-06-23 ENCOUNTER — Telehealth: Payer: Self-pay | Admitting: Internal Medicine

## 2012-06-23 NOTE — Telephone Encounter (Signed)
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  He was a no show on 05/17/12 for ID, I rescheduled him approx a month ago for 06/28/12.  His wife is advised that if he does not show for the appt they will not allow him to reschedule.  She is given the appt information

## 2012-06-26 ENCOUNTER — Emergency Department (HOSPITAL_COMMUNITY): Payer: Medicaid Other

## 2012-06-26 ENCOUNTER — Emergency Department (HOSPITAL_COMMUNITY)
Admission: EM | Admit: 2012-06-26 | Discharge: 2012-06-26 | Disposition: A | Discharge status: Home / Self Care | Payer: Medicaid Other | Attending: Emergency Medicine | Admitting: Emergency Medicine

## 2012-06-26 ENCOUNTER — Encounter (HOSPITAL_COMMUNITY): Payer: Self-pay | Admitting: Nurse Practitioner

## 2012-06-26 DIAGNOSIS — Z8669 Personal history of other diseases of the nervous system and sense organs: Secondary | ICD-10-CM | POA: Insufficient documentation

## 2012-06-26 DIAGNOSIS — Z8739 Personal history of other diseases of the musculoskeletal system and connective tissue: Secondary | ICD-10-CM | POA: Insufficient documentation

## 2012-06-26 DIAGNOSIS — Z8659 Personal history of other mental and behavioral disorders: Secondary | ICD-10-CM | POA: Insufficient documentation

## 2012-06-26 DIAGNOSIS — Z8679 Personal history of other diseases of the circulatory system: Secondary | ICD-10-CM | POA: Insufficient documentation

## 2012-06-26 DIAGNOSIS — Z79899 Other long term (current) drug therapy: Secondary | ICD-10-CM | POA: Insufficient documentation

## 2012-06-26 DIAGNOSIS — Z8719 Personal history of other diseases of the digestive system: Secondary | ICD-10-CM | POA: Insufficient documentation

## 2012-06-26 DIAGNOSIS — M79609 Pain in unspecified limb: Secondary | ICD-10-CM | POA: Insufficient documentation

## 2012-06-26 DIAGNOSIS — M79672 Pain in left foot: Secondary | ICD-10-CM

## 2012-06-26 DIAGNOSIS — Z87442 Personal history of urinary calculi: Secondary | ICD-10-CM | POA: Insufficient documentation

## 2012-06-26 DIAGNOSIS — Z87891 Personal history of nicotine dependence: Secondary | ICD-10-CM | POA: Insufficient documentation

## 2012-06-26 MED ORDER — HYDROCODONE-ACETAMINOPHEN 5-325 MG PO TABS: 1.0000 | ORAL_TABLET | ORAL | Status: DC | PRN

## 2012-06-26 MED ORDER — HYDROCODONE-ACETAMINOPHEN 5-325 MG PO TABS
1.0000 | ORAL_TABLET | Freq: Once | ORAL | Status: AC
  Administered 2012-06-26: 1 via ORAL
  Filled 2012-06-26: qty 1

## 2012-06-26 NOTE — ED Notes (Signed)
Pt had R foot surgery in April. Last night he noticed a throbbing pain in this foot at surgical site that has not gotten better today. Ambulatory, denies any new injuries to this foot.

## 2012-06-26 NOTE — ED Provider Notes (Signed)
Medical screening examination/treatment/procedure(s) were performed by non-physician practitioner and as supervising physician I was immediately available for consultation/collaboration. are  Doug Sou, MD 06/26/12 [phone]
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
## 2012-06-28 ENCOUNTER — Encounter: Payer: Self-pay | Admitting: Infectious Diseases

## 2012-06-28 ENCOUNTER — Ambulatory Visit (INDEPENDENT_AMBULATORY_CARE_PROVIDER_SITE_OTHER): Payer: Medicaid Other | Admitting: Infectious Diseases

## 2012-06-28 VITALS — BP 103/63 | HR 88 | Temp 98.3°F | Ht 69.0 in | Wt 168.0 lb

## 2012-06-28 DIAGNOSIS — S92902A Unspecified fracture of left foot, initial encounter for closed fracture: Secondary | ICD-10-CM

## 2012-06-28 DIAGNOSIS — R7611 Nonspecific reaction to tuberculin skin test without active tuberculosis: Secondary | ICD-10-CM

## 2012-06-28 DIAGNOSIS — S92909A Unspecified fracture of unspecified foot, initial encounter for closed fracture: Secondary | ICD-10-CM

## 2012-06-28 HISTORY — DX: Unspecified fracture of left foot, initial encounter for closed fracture: S92.902A

## 2012-06-28 LAB — COMPREHENSIVE METABOLIC PANEL
ALT: 12 U/L (ref 0–53)
CO2: 32 mEq/L (ref 19–32)
Creat: 1.17 mg/dL (ref 0.50–1.35)
Total Bilirubin: 0.3 mg/dL (ref 0.3–1.2)

## 2012-06-28 LAB — CBC
MCH: 30.6 pg (ref 26.0–34.0)
MCV: 90.6 fL (ref 78.0–100.0)
Platelets: 227 10*3/uL (ref 150–400)
RBC: 3.95 MIL/uL — ABNORMAL LOW (ref 4.22–5.81)

## 2012-06-28 MED ORDER — RIFAMPIN 300 MG PO CAPS: 600.0000 mg | ORAL_CAPSULE | Freq: Every day | ORAL | Status: DC
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
## 2012-06-28 NOTE — Assessment & Plan Note (Signed)
He still has pain and swelling. Will send him for MRI. I am concerned that he has either not healed or has an infection.  Will also check his HIV if not done in last year.

## 2012-06-28 NOTE — Assessment & Plan Note (Addendum)
Will start him on rifampin 600mg  qday. Will check his LFTs today He understands that he can't drink while he takes this. Will see him back in 2 weeks.

## 2012-06-29 LAB — HIV ANTIBODY (ROUTINE TESTING W REFLEX): HIV: NONREACTIVE

## 2012-07-12 ENCOUNTER — Encounter: Payer: Self-pay | Admitting: Infectious Diseases

## 2012-07-12 ENCOUNTER — Ambulatory Visit (INDEPENDENT_AMBULATORY_CARE_PROVIDER_SITE_OTHER): Payer: Medicaid Other | Admitting: Infectious Diseases

## 2012-07-12 VITALS — BP 104/69 | HR 81 | Temp 98.1°F | Ht 69.0 in | Wt 167.0 lb

## 2012-07-12 DIAGNOSIS — R7611 Nonspecific reaction to tuberculin skin test without active tuberculosis: Secondary | ICD-10-CM

## 2012-07-12 DIAGNOSIS — S92902S Unspecified fracture of left foot, sequela: Secondary | ICD-10-CM

## 2012-07-12 DIAGNOSIS — S8290XS Unspecified fracture of unspecified lower leg, sequela: Secondary | ICD-10-CM

## 2012-07-12 LAB — COMPLETE METABOLIC PANEL WITH GFR
ALT: 13 U/L (ref 0–53)
Albumin: 4.2 g/dL (ref 3.5–5.2)
CO2: 29 mEq/L (ref 19–32)
Calcium: 9.3 mg/dL (ref 8.4–10.5)
Chloride: 104 mEq/L (ref 96–112)
Creat: 1.17 mg/dL (ref 0.50–1.35)
GFR, Est African American: >89 mL/min
Sodium: 140 mEq/L (ref 135–145)
Total Protein: 6.7 g/dL (ref 6.0–8.3)

## 2012-07-12 NOTE — Assessment & Plan Note (Signed)
Not clear that he may have an abscess, had fluid drained from his foot. He is on augmentin, unfortunately specimen was lost. Will follow along with ortho.

## 2012-07-12 NOTE — Assessment & Plan Note (Signed)
Will keep him rifampin 600mg  for a total of 4 months. Will check his LFTs today. Will see him back in 3 months.
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
## 2012-07-18 ENCOUNTER — Telehealth: Payer: Self-pay | Admitting: *Deleted

## 2012-07-18 NOTE — Telephone Encounter (Signed)
MRI denied by patient's insurance. Dr. Ninetta Lights saw patient for follow up and stated will follow along with orthopedic MD. A peer to peer will have to be done through MD for MRI to be approved. Gary Barton

## 2012-08-23 ENCOUNTER — Telehealth: Payer: Self-pay | Admitting: Internal Medicine

## 2012-08-23 NOTE — Telephone Encounter (Signed)
C/o abdominal pain, rectal bleeding, constipation, and abdominal pain.  He is currently taking Rifampin per ID( see last office note from Dr. Ninetta Lights).  He is requesting prednisone.  Do you want to see him in the office I can put him on for Thursday, you have an opening or send rx?

## 2012-08-23 NOTE — Telephone Encounter (Signed)
Patient is scheduled for office visit 08/25/12 3:30

## 2012-08-23 NOTE — Telephone Encounter (Signed)
He will need to be seen 

## 2012-08-25 ENCOUNTER — Encounter: Payer: Self-pay | Admitting: Internal Medicine

## 2012-08-25 ENCOUNTER — Ambulatory Visit (INDEPENDENT_AMBULATORY_CARE_PROVIDER_SITE_OTHER): Payer: Medicaid Other | Admitting: Internal Medicine

## 2012-08-25 VITALS — BP 100/62 | HR 68 | Ht 69.0 in | Wt 166.6 lb

## 2012-08-25 DIAGNOSIS — K5000 Crohn's disease of small intestine without complications: Secondary | ICD-10-CM

## 2012-08-25 DIAGNOSIS — K50019 Crohn's disease of small intestine with unspecified complications: Secondary | ICD-10-CM

## 2012-08-25 DIAGNOSIS — K648 Other hemorrhoids: Secondary | ICD-10-CM

## 2012-08-25 MED ORDER — MESALAMINE 800 MG PO TBEC: 1600.0000 mg | DELAYED_RELEASE_TABLET | Freq: Three times a day (TID) | ORAL | Status: DC

## 2012-08-25 MED ORDER — HYDROCORTISONE ACETATE 25 MG RE SUPP: 25.0000 mg | Freq: Every day | RECTAL | Status: DC

## 2012-08-25 NOTE — Assessment & Plan Note (Signed)
Since he has had in past will Tx w/ HC suppositories x 12 ? If he needs NTG ointment - consider depending upon course

## 2012-08-25 NOTE — Assessment & Plan Note (Signed)
Sounds symptomatic again Best Tx option is mesalamine at this point given + PPD Will start Asacol HD 1.6 g tid Also Tx HC suppositories for rectal bleeding REV 3 months

## 2012-08-25 NOTE — Progress Notes (Signed)
  Subjective:    Patient ID: Gary Barton, male    DOB: 1970/07/05, 42 y.o.   MRN: [id number]  HPI The patient is here as he has been having some increased abdominal pain, rectal bleeding and diarrhea. He is off biologic Tx because of a + PPD. Now on rifampin.   Medications, allergies, past medical history, past surgical history, family history and social history are reviewed and updated in the EMR.  Review of Systems As above    Objective:   Physical Exam WDWN, NAD Abdomen is soft, benign and nontender Rectal w/ male staff present shows normla anoderm and some anal stenosis. Anoscopy performed - stenosis prevented full exam so could not see if he has hemorrhoids though has had in past.     Assessment & Plan:  Crohn's ileitis, unspecified complication  Internal hemorrhoids

## 2012-08-25 NOTE — Patient Instructions (Addendum)
We have sent the medications to your pharmacy for you to pick up at your convenience.  Follow up with [place] in 3 months.   I appreciate the opportunity to care for you.

## 2012-08-26 ENCOUNTER — Telehealth: Payer: Self-pay

## 2012-08-26 MED ORDER — MESALAMINE ER 500 MG PO CPCR: 1500.0000 mg | ORAL_CAPSULE | Freq: Three times a day (TID) | ORAL | Status: DC

## 2012-08-26 NOTE — Telephone Encounter (Signed)
Confirmed with Dr. Leone Payor that he wanted Pentasa 1500mg  tid, since the pills come in a 500mg  size.  Spoke to patient and told him that we are changing his rx from Asacol HD to Pentasa to get it covered by his insurance.

## 2012-08-26 NOTE — Telephone Encounter (Signed)
Message copied by [place], Rawleigh Rode E on Fri Aug 26, 2012  3:48 PM ------      Message from: Iva Boop      Created: Fri Aug 26, 2012  1:27 PM       Pentasa 1600 mg tid plese - 1 month supply and 3 refills      ----- Message -----         From: Lue [place] E [place], CMA         Sent: 08/26/2012  12:47 PM           To: Iva Boop, MD            Asacol HD 800mg  not covered by Medicaid, first have to try and fail Apriso, Pentasa, Sulfasalazine, or balsalazide.  Please advise?       ------

## 2012-10-11 ENCOUNTER — Encounter (HOSPITAL_COMMUNITY): Payer: Self-pay | Admitting: Emergency Medicine

## 2012-10-11 ENCOUNTER — Emergency Department (HOSPITAL_COMMUNITY)
Admission: EM | Admit: 2012-10-11 | Discharge: 2012-10-11 | Disposition: A | Discharge status: Nursing Home (Includes ICF) | Payer: Medicaid Other | Source: Home / Self Care | Attending: Family Medicine | Admitting: Family Medicine

## 2012-10-11 ENCOUNTER — Emergency Department (INDEPENDENT_AMBULATORY_CARE_PROVIDER_SITE_OTHER): Payer: Medicaid Other

## 2012-10-11 DIAGNOSIS — R634 Abnormal weight loss: Secondary | ICD-10-CM

## 2012-10-11 NOTE — ED Notes (Signed)
C/o weight loss. Sweats. Productive cough with yellow/brown sputum. N/v. Denies fever and diarrhea. Hx of chrons disease.   Symptoms present x 3 to 4 months. Gradually getting worse.
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
## 2012-10-25 ENCOUNTER — Ambulatory Visit: Payer: Medicaid Other | Admitting: Family Medicine

## 2012-10-26 ENCOUNTER — Ambulatory Visit (INDEPENDENT_AMBULATORY_CARE_PROVIDER_SITE_OTHER): Payer: Medicaid Other | Admitting: Infectious Diseases

## 2012-10-26 ENCOUNTER — Encounter: Payer: Self-pay | Admitting: Infectious Diseases

## 2012-10-26 VITALS — BP 143/87 | HR 69 | Temp 98.1°F | Ht 68.5 in | Wt 168.0 lb

## 2012-10-26 DIAGNOSIS — R7611 Nonspecific reaction to tuberculin skin test without active tuberculosis: Secondary | ICD-10-CM

## 2012-10-26 DIAGNOSIS — S8290XD Unspecified fracture of unspecified lower leg, subsequent encounter for closed fracture with routine healing: Secondary | ICD-10-CM

## 2012-10-26 DIAGNOSIS — S92902G Unspecified fracture of left foot, subsequent encounter for fracture with delayed healing: Secondary | ICD-10-CM

## 2012-10-26 NOTE — Assessment & Plan Note (Signed)
He has gotten 4 months of rifampin. Would consider his latent TB treated. Would consider restarting his Remicaide if this would be helpful for his Crohn's. Will see him back prn.
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
## 2012-10-26 NOTE — Assessment & Plan Note (Signed)
This appears to be healed by pt's report. Will be available as needed.

## 2012-12-13 ENCOUNTER — Encounter: Payer: Self-pay | Admitting: Internal Medicine

## 2012-12-13 ENCOUNTER — Other Ambulatory Visit (INDEPENDENT_AMBULATORY_CARE_PROVIDER_SITE_OTHER): Payer: Medicaid Other

## 2012-12-13 ENCOUNTER — Ambulatory Visit (INDEPENDENT_AMBULATORY_CARE_PROVIDER_SITE_OTHER): Payer: Medicaid Other | Admitting: Internal Medicine

## 2012-12-13 ENCOUNTER — Other Ambulatory Visit: Payer: Self-pay | Admitting: *Deleted

## 2012-12-13 VITALS — BP 120/84 | HR 72 | Temp 98.1°F | Ht 68.5 in | Wt 169.2 lb

## 2012-12-13 DIAGNOSIS — K5000 Crohn's disease of small intestine without complications: Secondary | ICD-10-CM

## 2012-12-13 DIAGNOSIS — Z227 Latent tuberculosis: Secondary | ICD-10-CM

## 2012-12-13 DIAGNOSIS — Z23 Encounter for immunization: Secondary | ICD-10-CM

## 2012-12-13 DIAGNOSIS — E739 Lactose intolerance, unspecified: Secondary | ICD-10-CM | POA: Insufficient documentation

## 2012-12-13 DIAGNOSIS — K5090 Crohn's disease, unspecified, without complications: Secondary | ICD-10-CM

## 2012-12-13 DIAGNOSIS — R7611 Nonspecific reaction to tuberculin skin test without active tuberculosis: Secondary | ICD-10-CM

## 2012-12-13 HISTORY — DX: Latent tuberculosis: Z22.7

## 2012-12-13 LAB — CBC WITH DIFFERENTIAL/PLATELET
Basophils Absolute: 0 10*3/uL (ref 0.0–0.1)
Eosinophils Absolute: 0.3 10*3/uL (ref 0.0–0.7)
Lymphocytes Relative: 25 % (ref 12.0–46.0)
MCHC: 33.5 g/dL (ref 30.0–36.0)
Neutrophils Relative %: 50.2 % (ref 43.0–77.0)
Platelets: 218 10*3/uL (ref 150.0–400.0)
RDW: 14.5 % (ref 11.5–14.6)

## 2012-12-13 LAB — COMPREHENSIVE METABOLIC PANEL
AST: 25 U/L (ref 0–37)
Albumin: 4.3 g/dL (ref 3.5–5.2)
Alkaline Phosphatase: 81 U/L (ref 39–117)
Potassium: 4.5 mEq/L (ref 3.5–5.1)
Sodium: 138 mEq/L (ref 135–145)
Total Bilirubin: 0.4 mg/dL (ref 0.3–1.2)
Total Protein: 7.7 g/dL (ref 6.0–8.3)

## 2012-12-13 MED ORDER — HYDROCODONE-ACETAMINOPHEN 5-325 MG PO TABS: 1.0000 | ORAL_TABLET | ORAL | Status: DC | PRN

## 2012-12-13 NOTE — Patient Instructions (Addendum)
Your physician has requested that you go to the basement for the following lab work before leaving today: CBC/diff, CMET, C- Reactive Protein, IBD Expanded Panel  Today you have been given a printed rx for Vicodin 5/325 to take to the pharmacy.  Darcey Nora, [facility] will be in touch about re-starting your Remicade.  Today we are giving you handouts to read and follow on Low Fiber Diet and Lactose-Free diet information.  Today we are setting you up for a capsule endo study.  Today you have been given a flu vaccine.  I appreciate the opportunity to care for you.

## 2012-12-13 NOTE — Assessment & Plan Note (Addendum)
Off biologic therapy x months for tx + PPD - anticipate restarting Remicade Low fiber diet Lactose free diet ibd expanded panel Cbc crp cmet

## 2012-12-13 NOTE — Progress Notes (Signed)
  Subjective:    Patient ID: Gary Barton, male    DOB: 1970/11/14, 42 y.o.   MRN: [id number]  HPI Returns after a long absence, he had been undergoing treatment for a positive PPD and is now cleared to return to use biologic therapy. He is complaining of intermittent rectal bleeding and some mucoid discharge, abdominal bloating, some constipation and nausea. These are all consistent with his chronic recurrent symptoms. His appetite is off, and he thinks he is losing significant amounts of weight. He has some severe crampy abdominal pain at times. He does not have any pain medication at this time. He denies using any ibuprofen. He claims compliance with his mesalamine. Medications, allergies, past medical history, past surgical history, family history and social history are reviewed and updated in the EMR.   Review of Systems + dry cough, no fever, he is finally through with his left ankle surgery and foot surgeries. That is improved.    Objective:   Physical Exam General:  Well-developed, well-nourished and in no acute distress Eyes:  anicteric. ENT:   Mouth and posterior pharynx free of lesions.  Neck:   supple w/o thyromegaly or mass.  Lungs: Clear to auscultation bilaterally. Heart:  S1S2, no rubs, murmurs, gallops. Abdomen:  soft, non-tender, no hepatosplenomegaly, hernia, or mass and BS+.  Rectal: Mild stenosis and tenderness, did not tolerate anoscopy Lymph:  no cervical or supraclavicular adenopathy. Extremities:   no edema Skin   no rash. + tattoos and piercings Neuro:  A&O x 3.  Psych:  appropriate mood and  Affect.   Data Reviewed: Wt Readings from Last 3 Encounters:  12/13/12 169 lb 3.2 oz (76.749 kg)  10/26/12 168 lb (76.204 kg)  08/25/12 166 lb 9.6 oz (75.569 kg)   Lab Results  Component Value Date   WBC 4.9 12/13/2012   HGB 13.0 12/13/2012   HCT 38.7* 12/13/2012   MCV 93.0 12/13/2012   PLT 218.0 12/13/2012     Chemistry      Component Value Date/Time   NA 138 12/13/2012 0954   K 4.5 12/13/2012 0954   CL 102 12/13/2012 0954   CO2 32 12/13/2012 0954   BUN 14 12/13/2012 0954   CREATININE 1.2 12/13/2012 0954   CREATININE 1.17 07/12/2012 1118      Component Value Date/Time   CALCIUM 9.7 12/13/2012 0954   ALKPHOS 81 12/13/2012 0954   AST 25 12/13/2012 0954   ALT 17 12/13/2012 0954   BILITOT 0.4 12/13/2012 0954        Assessment & Plan:  Crohn's ileitis - Plan: CBC with Differential, C-reactive protein, Comp Met (CMET), IBD Expanded Panel  Lactose intolerance  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

## 2012-12-14 NOTE — Assessment & Plan Note (Signed)
By hx - tx w/ diet restrictions

## 2012-12-14 NOTE — Assessment & Plan Note (Signed)
resolved 

## 2012-12-15 ENCOUNTER — Telehealth: Payer: Self-pay

## 2012-12-15 DIAGNOSIS — K5090 Crohn's disease, unspecified, without complications: Secondary | ICD-10-CM

## 2012-12-15 LAB — IBD EXPANDED PANEL
ACCA: 34 units (ref 0–90)
AMCA: 17 units (ref 0–100)
Atypical pANCA: NEGATIVE

## 2012-12-15 NOTE — Telephone Encounter (Signed)
Patient advised that he needs to come for alb work prior to resuming remicade.  He is asked to come for the labs when he comes for the capsule endoscopy 12/20/12

## 2012-12-20 ENCOUNTER — Ambulatory Visit (INDEPENDENT_AMBULATORY_CARE_PROVIDER_SITE_OTHER): Payer: Medicaid Other | Admitting: Internal Medicine

## 2012-12-20 DIAGNOSIS — K921 Melena: Secondary | ICD-10-CM

## 2012-12-20 NOTE — Progress Notes (Signed)
Patient arrived for capsule endoscopy. Patient has been NPO and did the required prep. Patient given verbal and written instructions for procedure today. Patient had no problems swallowing the capsule. Lot [id number] 25 Expires- 2016-01.

## 2012-12-27 ENCOUNTER — Telehealth: Payer: Self-pay | Admitting: *Deleted

## 2012-12-27 NOTE — Telephone Encounter (Signed)
Spoke with patient and he passed the capsule.

## 2012-12-27 NOTE — Telephone Encounter (Signed)
Left a message for patient to call me. 

## 2012-12-27 NOTE — Telephone Encounter (Signed)
Message copied by Daphine Deutscher on Tue Dec 27, 2012  8:49 AM ------      Message from: Daphine Deutscher      Created: Tue Dec 20, 2012  4:22 PM       Call patient and see if he passed capsule ------

## 2013-01-10 ENCOUNTER — Encounter: Payer: Self-pay | Admitting: Internal Medicine

## 2013-01-10 ENCOUNTER — Telehealth: Payer: Self-pay

## 2013-01-10 NOTE — Telephone Encounter (Signed)
Message copied by Annett Fabian on Tue Jan 10, 2013 10:37 AM ------      Message from: Stan Head E      Created: Mon Jan 09, 2013  4:15 PM      Regarding: capsule endo/labs       1) capsule endo still shows ulcers in the small bowel that are probably Crohn's      2) Needs Infliximab Ab's as you already told him - please remind him      Need those before restarting Remicade             ------

## 2013-01-10 NOTE — Telephone Encounter (Signed)
Patient advised of the results He is reminded to come for labs as Dr. Leone Payor requested

## 2013-01-11 ENCOUNTER — Other Ambulatory Visit: Payer: Medicaid Other

## 2013-01-11 DIAGNOSIS — K5090 Crohn's disease, unspecified, without complications: Secondary | ICD-10-CM

## 2013-01-12 ENCOUNTER — Telehealth: Payer: Self-pay | Admitting: Internal Medicine

## 2013-01-12 MED ORDER — PROMETHAZINE HCL 25 MG PO TABS: 25.0000 mg | ORAL_TABLET | Freq: Three times a day (TID) | ORAL | Status: AC | PRN

## 2013-01-12 NOTE — Telephone Encounter (Signed)
Patient advised.

## 2013-01-12 NOTE — Telephone Encounter (Signed)
Dr. Leone Payor can we call him in something for nausea? He did come for his lab work.

## 2013-01-12 NOTE — Telephone Encounter (Signed)
Promethazine 25 mp po every 8 hrs prn #30 1 refill

## 2013-01-22 NOTE — Progress Notes (Signed)
Quick Note:  Remicade antibodies are high - he is resistant Need to look for new Tx Can you let him know and see if Humira, Cimzia are available or if Thompson Grayer is - need to tell insurance he is refractory to Remicade ______

## 2013-01-23 ENCOUNTER — Other Ambulatory Visit: Payer: Self-pay

## 2013-01-23 MED ORDER — ADALIMUMAB 40 MG/0.8ML ~~LOC~~ KIT: PACK | SUBCUTANEOUS | Status: DC

## 2013-01-30 ENCOUNTER — Other Ambulatory Visit: Payer: Self-pay

## 2013-01-30 MED ORDER — ADALIMUMAB 40 MG/0.8ML ~~LOC~~ KIT: PACK | SUBCUTANEOUS | Status: DC

## 2013-01-30 MED ORDER — ADALIMUMAB 40 MG/0.8ML ~~LOC~~ KIT: PACK | SUBCUTANEOUS | Status: AC

## 2013-02-03 ENCOUNTER — Telehealth: Payer: Self-pay | Admitting: Internal Medicine

## 2013-02-03 NOTE — Telephone Encounter (Signed)
I have left a message for the patient that he should refrigerate the Humira and that he should be contacted by [facility] to set up a teaching.

## 2013-02-06 ENCOUNTER — Telehealth: Payer: Self-pay | Admitting: Internal Medicine

## 2013-02-06 NOTE — Telephone Encounter (Signed)
Patient has been contacted by the Humira nurse to set up the injections.  He states he has a call into her.

## 2013-02-06 NOTE — Telephone Encounter (Signed)
Patient advised that I will call Humira nurse and check on the status

## 2013-02-08 NOTE — Telephone Encounter (Signed)
Humira has him assigned to a local [place] agency and they are going to complete the injection training this week.  He does have the medication

## 2013-06-16 ENCOUNTER — Ambulatory Visit (INDEPENDENT_AMBULATORY_CARE_PROVIDER_SITE_OTHER): Payer: Medicaid Other | Admitting: Internal Medicine

## 2013-06-16 ENCOUNTER — Encounter: Payer: Self-pay | Admitting: Internal Medicine

## 2013-06-16 VITALS — BP 110/80 | HR 64 | Ht 68.5 in | Wt 164.4 lb

## 2013-06-16 DIAGNOSIS — F32A Depression, unspecified: Secondary | ICD-10-CM | POA: Insufficient documentation

## 2013-06-16 DIAGNOSIS — K648 Other hemorrhoids: Secondary | ICD-10-CM

## 2013-06-16 DIAGNOSIS — K5000 Crohn's disease of small intestine without complications: Secondary | ICD-10-CM

## 2013-06-16 DIAGNOSIS — K602 Anal fissure, unspecified: Secondary | ICD-10-CM

## 2013-06-16 DIAGNOSIS — F329 Major depressive disorder, single episode, unspecified: Secondary | ICD-10-CM

## 2013-06-16 DIAGNOSIS — F3289 Other specified depressive episodes: Secondary | ICD-10-CM

## 2013-06-16 MED ORDER — DILTIAZEM GEL 2 %: 1.0000 "application " | Freq: Two times a day (BID) | CUTANEOUS | Status: AC

## 2013-06-16 MED ORDER — MIRTAZAPINE 15 MG PO TABS: 15.0000 mg | ORAL_TABLET | Freq: Every day | ORAL | Status: DC

## 2013-06-16 MED ORDER — POLYETHYLENE GLYCOL 3350 17 GM/SCOOP PO POWD: 17.0000 g | Freq: Two times a day (BID) | ORAL | Status: AC

## 2013-06-16 NOTE — Assessment & Plan Note (Signed)
Reassess July

## 2013-06-16 NOTE — Assessment & Plan Note (Signed)
He has many sxs of this so will try mirtazipine and that may help nausea, anorexia.

## 2013-06-16 NOTE — Patient Instructions (Signed)
We have sent medications to [facility] for you to pick up at your convenience.  We are giving you a printed rx for diltiazem gel to take to the pharmacy.   Follow up with [place] in 2 months.   I appreciate the opportunity to care for you.

## 2013-06-16 NOTE — Progress Notes (Signed)
   Subjective:    Patient ID: Gary Barton, male    DOB: [birth year], 43 y.o.   MRN: [id number]  HPI Still with painful defecation and rectal bleeding and is describing what sounds like prolapsed hemorrhoids  Early AM nausea and regugitation/vomiting. Not sleeping well.  Bowel movements are hard.  No sex drive and no appetite. Irritable. Wife says mood swings. No physical threats Seroquel did not help so stopped.  Review of Systems As above    Objective:   Physical Exam General:  NAD Eyes:   anicteric Abdomen:  soft and nontender, BS+ Ext:   no edema  Rectal: Anoderm inspection revealed no abnormaliti Digital exam revealed mild-mod stenosis and tenderness - no mass  Anoscopy was performed with the patient in the left lateral decubitus position while a was present and revealed Grade 2 inflamed internal hemorrhoids all positions        Assessment & Plan:  Anal fissure Clinically has one Diltiazem gel RTC July  Hemorrhoids, internal, with bleeding Consider banding if pain better w/ diltiazem  Crohn's ileitis  Reassess July   Depression He has many sxs of this so will try mirtazipine and that may help nausea, anorexia.

## 2013-06-16 NOTE — Assessment & Plan Note (Signed)
Clinically has one Diltiazem gel RTC July

## 2013-06-16 NOTE — Assessment & Plan Note (Signed)
Consider banding if pain better w/ diltiazem

## 2013-07-12 ENCOUNTER — Ambulatory Visit: Payer: Medicaid Other | Admitting: Physician Assistant

## 2013-07-17 ENCOUNTER — Ambulatory Visit (INDEPENDENT_AMBULATORY_CARE_PROVIDER_SITE_OTHER): Payer: Medicaid Other | Admitting: Physician Assistant

## 2013-07-17 ENCOUNTER — Encounter: Payer: Self-pay | Admitting: Physician Assistant

## 2013-07-17 VITALS — BP 110/78 | HR 96 | Temp 98.5°F | Resp 18 | Wt 163.0 lb

## 2013-07-17 DIAGNOSIS — R51 Headache: Secondary | ICD-10-CM

## 2013-07-17 DIAGNOSIS — Z8249 Family history of ischemic heart disease and other diseases of the circulatory system: Secondary | ICD-10-CM

## 2013-07-17 DIAGNOSIS — Z823 Family history of stroke: Secondary | ICD-10-CM
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
## 2013-07-20 ENCOUNTER — Other Ambulatory Visit: Payer: Self-pay | Admitting: Infectious Diseases

## 2013-07-20 ENCOUNTER — Other Ambulatory Visit: Payer: Self-pay | Admitting: Physician Assistant

## 2013-07-20 DIAGNOSIS — Z139 Encounter for screening, unspecified: Secondary | ICD-10-CM

## 2013-07-21 ENCOUNTER — Ambulatory Visit
Admission: RE | Admit: 2013-07-21 | Discharge: 2013-07-21 | Disposition: A | Discharge status: Home / Self Care | Payer: Medicaid Other | Source: Ambulatory Visit | Attending: Physician Assistant | Admitting: Physician Assistant

## 2013-07-21 DIAGNOSIS — Z139 Encounter for screening, unspecified: Secondary | ICD-10-CM

## 2013-07-24 ENCOUNTER — Other Ambulatory Visit: Payer: Medicaid Other

## 2013-08-15 ENCOUNTER — Ambulatory Visit (INDEPENDENT_AMBULATORY_CARE_PROVIDER_SITE_OTHER): Payer: Medicaid Other | Admitting: Internal Medicine

## 2013-08-15 ENCOUNTER — Other Ambulatory Visit (INDEPENDENT_AMBULATORY_CARE_PROVIDER_SITE_OTHER): Payer: Medicaid Other

## 2013-08-15 ENCOUNTER — Encounter: Payer: Self-pay | Admitting: Internal Medicine

## 2013-08-15 VITALS — BP 80/60 | HR 68 | Ht 68.5 in | Wt 167.4 lb

## 2013-08-15 DIAGNOSIS — F329 Major depressive disorder, single episode, unspecified: Secondary | ICD-10-CM

## 2013-08-15 DIAGNOSIS — K602 Anal fissure, unspecified: Secondary | ICD-10-CM

## 2013-08-15 DIAGNOSIS — R1314 Dysphagia, pharyngoesophageal phase: Secondary | ICD-10-CM

## 2013-08-15 DIAGNOSIS — K50011 Crohn's disease of small intestine with rectal bleeding: Secondary | ICD-10-CM

## 2013-08-15 DIAGNOSIS — K5000 Crohn's disease of small intestine without complications: Secondary | ICD-10-CM | POA: Diagnosis not present

## 2013-08-15 DIAGNOSIS — F32A Depression, unspecified: Secondary | ICD-10-CM

## 2013-08-15 DIAGNOSIS — F3289 Other specified depressive episodes: Secondary | ICD-10-CM

## 2013-08-15 LAB — CBC WITH DIFFERENTIAL/PLATELET
BASOS PCT: 0.6 % (ref 0.0–3.0)
Basophils Absolute: 0 10*3/uL (ref 0.0–0.1)
EOS PCT: 6.2 % — AB (ref 0.0–5.0)
Eosinophils Absolute: 0.4 10*3/uL (ref 0.0–0.7)
HEMATOCRIT: 38.8 % — AB (ref 39.0–52.0)
Hemoglobin: 12.9 g/dL — ABNORMAL LOW (ref 13.0–17.0)
LYMPHS ABS: 2.3 10*3/uL (ref 0.7–4.0)
Lymphocytes Relative: 36.5 % (ref 12.0–46.0)
MCHC: 33.2 g/dL (ref 30.0–36.0)
MCV: 94.6 fl (ref 78.0–100.0)
MONO ABS: 0.7 10*3/uL (ref 0.1–1.0)
MONOS PCT: 11 % (ref 3.0–12.0)
Neutro Abs: 2.9 10*3/uL (ref 1.4–7.7)
Neutrophils Relative %: 45.7 % (ref 43.0–77.0)
Platelets: 235 10*3/uL (ref 150.0–400.0)
RBC: 4.1 Mil/uL — AB (ref 4.22–5.81)
RDW: 14.1 % (ref 11.5–15.5)
WBC: 6.3 10*3/uL (ref 4.0–10.5)

## 2013-08-15 LAB — C-REACTIVE PROTEIN

## 2013-08-15 LAB — COMPREHENSIVE METABOLIC PANEL
ALBUMIN: 4.1 g/dL (ref 3.5–5.2)
ALK PHOS: 70 U/L (ref 39–117)
ALT: 17 U/L (ref 0–53)
AST: 21 U/L (ref 0–37)
BUN: 12 mg/dL (ref 6–23)
CO2: 31 meq/L (ref 19–32)
Calcium: 9.4 mg/dL (ref 8.4–10.5)
Chloride: 101 mEq/L (ref 96–112)
Creatinine, Ser: 1.2 mg/dL (ref 0.4–1.5)
GFR: 87.66 mL/min (ref >60.00)
GLUCOSE: 79 mg/dL (ref 70–99)
POTASSIUM: 4.7 meq/L (ref 3.5–5.1)
Sodium: 137 mEq/L (ref 135–145)
Total Bilirubin: 0.6 mg/dL (ref 0.2–1.2)
Total Protein: 7 g/dL (ref 6.0–8.3)

## 2013-08-15 MED ORDER — MIRTAZAPINE 30 MG PO TABS: 30.0000 mg | ORAL_TABLET | Freq: Every day | ORAL | Status: AC

## 2013-08-15 NOTE — Assessment & Plan Note (Signed)
EGD - with dilation The risks and benefits as well as alternatives of endoscopic procedure(s) have been discussed and reviewed. All questions answered. The patient agrees to proceed.

## 2013-08-15 NOTE — Assessment & Plan Note (Signed)
Increase mirtazipine to 30 mg
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
## 2013-08-15 NOTE — Assessment & Plan Note (Signed)
Labs, including adalimumab levels Colonoscopy The risks and benefits as well as alternatives of endoscopic procedure(s) have been discussed and reviewed. All questions answered. The patient agrees to proceed.

## 2013-08-15 NOTE — Patient Instructions (Addendum)
You have been scheduled for an endoscopy and colonoscopy at [facility]. Please follow the written instructions given to you at your visit today. Please pick up your prep at the pharmacy .  All items are over the counter. If you use inhalers (even only as needed), please bring them with you on the day of your procedure.  We have sent the following medications to your pharmacy for you to pick up at your convenience: Remeron   Your physician has requested that you go to the basement for lab work before leaving today.   I appreciate the opportunity to care for you.

## 2013-08-15 NOTE — Progress Notes (Signed)
Verbally given to [place] at [facility], she said there system has been down for an hour this AM.

## 2013-08-15 NOTE — Assessment & Plan Note (Signed)
Rectal exam non-tender today

## 2013-08-16 LAB — ADALIMUMAB+AB (SERIAL MONITOR)

## 2013-08-18 NOTE — Progress Notes (Signed)
Quick Note:  Can we see if this can be redrawn properly next week?  ______

## 2013-08-21 ENCOUNTER — Encounter (HOSPITAL_COMMUNITY): Payer: Self-pay | Admitting: *Deleted

## 2013-08-21 ENCOUNTER — Other Ambulatory Visit: Payer: Self-pay

## 2013-08-21 DIAGNOSIS — K50118 Crohn's disease of large intestine with other complication: Secondary | ICD-10-CM

## 2013-08-24 ENCOUNTER — Other Ambulatory Visit: Payer: Medicaid Other

## 2013-08-24 DIAGNOSIS — K50118 Crohn's disease of large intestine with other complication: Secondary | ICD-10-CM

## 2013-08-25 ENCOUNTER — Encounter (HOSPITAL_COMMUNITY)
Admission: RE | Disposition: A | Discharge status: Home / Self Care | Payer: Self-pay | Source: Ambulatory Visit | Attending: Internal Medicine

## 2013-08-25 ENCOUNTER — Ambulatory Visit (HOSPITAL_COMMUNITY)
Admission: RE | Admit: 2013-08-25 | Discharge: 2013-08-25 | Disposition: A | Discharge status: Home / Self Care | Payer: Medicaid Other | Source: Ambulatory Visit | Attending: Internal Medicine | Admitting: Internal Medicine

## 2013-08-25 ENCOUNTER — Encounter (HOSPITAL_COMMUNITY): Payer: Medicaid Other | Admitting: Anesthesiology

## 2013-08-25 ENCOUNTER — Ambulatory Visit (HOSPITAL_COMMUNITY): Payer: Medicaid Other | Admitting: Anesthesiology

## 2013-08-25 ENCOUNTER — Encounter (HOSPITAL_COMMUNITY): Payer: Self-pay

## 2013-08-25 DIAGNOSIS — F3289 Other specified depressive episodes: Secondary | ICD-10-CM | POA: Insufficient documentation

## 2013-08-25 DIAGNOSIS — F329 Major depressive disorder, single episode, unspecified: Secondary | ICD-10-CM | POA: Insufficient documentation

## 2013-08-25 DIAGNOSIS — K219 Gastro-esophageal reflux disease without esophagitis: Secondary | ICD-10-CM | POA: Diagnosis not present

## 2013-08-25 DIAGNOSIS — R1314 Dysphagia, pharyngoesophageal phase: Secondary | ICD-10-CM

## 2013-08-25 DIAGNOSIS — K5000 Crohn's disease of small intestine without complications: Secondary | ICD-10-CM | POA: Insufficient documentation

## 2013-08-25 DIAGNOSIS — Z886 Allergy status to analgesic agent status: Secondary | ICD-10-CM | POA: Diagnosis not present

## 2013-08-25 DIAGNOSIS — K648 Other hemorrhoids: Secondary | ICD-10-CM | POA: Diagnosis not present

## 2013-08-25 DIAGNOSIS — Z87891 Personal history of nicotine dependence: Secondary | ICD-10-CM | POA: Insufficient documentation

## 2013-08-25 DIAGNOSIS — R1310 Dysphagia, unspecified: Secondary | ICD-10-CM | POA: Diagnosis present

## 2013-08-25 DIAGNOSIS — K50011 Crohn's disease of small intestine with rectal bleeding: Secondary | ICD-10-CM

## 2013-08-25 DIAGNOSIS — Z79899 Other long term (current) drug therapy: Secondary | ICD-10-CM | POA: Insufficient documentation

## 2013-08-25 DIAGNOSIS — M19049 Primary osteoarthritis, unspecified hand: Secondary | ICD-10-CM | POA: Diagnosis not present

## 2013-08-25 DIAGNOSIS — F411 Generalized anxiety disorder: Secondary | ICD-10-CM | POA: Diagnosis not present

## 2013-08-25 DIAGNOSIS — K602 Anal fissure, unspecified: Secondary | ICD-10-CM | POA: Diagnosis not present

## 2013-08-25 HISTORY — PX: COLONOSCOPY: SHX5424

## 2013-08-25 HISTORY — PX: ESOPHAGOGASTRODUODENOSCOPY: SHX5428

## 2013-08-25 SURGERY — EGD (ESOPHAGOGASTRODUODENOSCOPY)
Anesthesia: Monitor Anesthesia Care

## 2013-08-25 MED ORDER — LIDOCAINE HCL (CARDIAC) 20 MG/ML IV SOLN
INTRAVENOUS | Status: AC
  Filled 2013-08-25: qty 5

## 2013-08-25 MED ORDER — LACTATED RINGERS IV SOLN
INTRAVENOUS | Status: DC
  Administered 2013-08-25: 13:00:00 via INTRAVENOUS

## 2013-08-25 MED ORDER — LIDOCAINE HCL (CARDIAC) 20 MG/ML IV SOLN
INTRAVENOUS | Status: DC | PRN
  Administered 2013-08-25: 100 mg via INTRAVENOUS

## 2013-08-25 MED ORDER — PROPOFOL INFUSION 10 MG/ML OPTIME
INTRAVENOUS | Status: DC | PRN
  Administered 2013-08-25: 120 ug/kg/min via INTRAVENOUS

## 2013-08-25 MED ORDER — PROPOFOL 10 MG/ML IV BOLUS
INTRAVENOUS | Status: DC | PRN
  Administered 2013-08-25: 40 mg via INTRAVENOUS

## 2013-08-25 MED ORDER — SODIUM CHLORIDE 0.9 % IV SOLN: INTRAVENOUS | Status: DC

## 2013-08-25 MED ORDER — PROPOFOL 10 MG/ML IV BOLUS
INTRAVENOUS | Status: AC
  Filled 2013-08-25: qty 40

## 2013-08-25 MED ORDER — MIDAZOLAM HCL 5 MG/5ML IJ SOLN
INTRAMUSCULAR | Status: DC | PRN
  Administered 2013-08-25: 2 mg via INTRAVENOUS

## 2013-08-25 MED ORDER — MIDAZOLAM HCL 2 MG/2ML IJ SOLN
INTRAMUSCULAR | Status: AC
  Filled 2013-08-25: qty 2

## 2013-08-25 MED ORDER — GLYCOPYRROLATE 2 MG PO TABS: 2.0000 mg | ORAL_TABLET | Freq: Two times a day (BID) | ORAL | Status: AC

## 2013-08-25 NOTE — Interval H&P Note (Signed)
History and Physical Interval Note:  08/25/2013 1:20 PM  Gary Barton  has presented today for surgery, with the diagnosis of Dysphagia [787.20]Crohn's ileitis, with rectal bleeding [555.0]  The various methods of treatment have been discussed with the patient and family. After consideration of risks, benefits and other options for treatment, the patient has consented to  Procedure(s): ESOPHAGOGASTRODUODENOSCOPY (EGD) (N/A) COLONOSCOPY (N/A) as a surgical intervention .  The patient's history has been reviewed, patient examined, no change in status, stable for surgery.  I have reviewed the patient's chart and labs.  Questions were answered to the patient's satisfaction.     Silvano Rusk

## 2013-08-25 NOTE — Transfer of Care (Signed)
Immediate Anesthesia Transfer of Care Note  Patient: Gary Barton  Procedure(s) Performed: Procedure(s): ESOPHAGOGASTRODUODENOSCOPY (EGD) (N/A) COLONOSCOPY (N/A)  Patient Location: PACU and Endoscopy Unit  Anesthesia Type:MAC  Level of Consciousness: awake, alert , oriented and patient cooperative  Airway & Oxygen Therapy: Patient Spontanous Breathing and Patient connected to nasal cannula oxygen  Post-op Assessment: Report given to PACU RN, Post -op Vital signs reviewed and stable and Patient moving all extremities  Post vital signs: Reviewed and stable  Complications: No apparent anesthesia complications
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
## 2013-08-25 NOTE — Op Note (Signed)
[facility] [place], [id number]   COLONOSCOPY PROCEDURE REPORT  PATIENT: Gary, Barton.  MR#: [id number] BIRTHDATE: Dec 07, 1970 , 42  yrs. old GENDER: Male ENDOSCOPIST: Gatha Mayer, MD, [facility] PROCEDURE DATE:  08/25/2013 PROCEDURE:   Colonoscopy, diagnostic First Screening Colonoscopy - Avg.  risk and is 50 yrs.  old or older - No.  Prior Negative Screening - Now for repeat screening. N/A  History of Adenoma - Now for follow-up colonoscopy & has been > or = to 3 yrs.  N/A  Polyps Removed Today? No.  Recommend repeat exam, <10 yrs? No. ASA CLASS:   Class II INDICATIONS:High risk patient with previously diagnosed Crohn's disease: small intestine. MEDICATIONS: See Anesthesia Report.  DESCRIPTION OF PROCEDURE:   After the risks benefits and alternatives of the procedure were thoroughly explained, informed consent was obtained.  A digital rectal exam revealed no abnormalities of the rectum, A digital rectal exam revealed the prostate was not enlarged, and A digital rectal exam revealed no prostatic nodules.   The     endoscope was introduced through the anus and advanced to the terminal ileum which was intubated for a short distance. No adverse events experienced.   The quality of the prep was Suprep good  The instrument was then slowly withdrawn as the colon was fully examined.      COLON FINDINGS: The mucosa appeared normal in the terminal ileum. A normal appearing cecum, ileocecal valve, and appendiceal orifice were identified.  The ascending, hepatic flexure, transverse, splenic flexure, descending, sigmoid colon and rectum appeared unremarkable.  No polyps or cancers were seen.  Retroflexed views revealed internal hemorrhoids. The time to cecum=2 minutes 0 seconds.  Withdrawal time=10 minutes 0 seconds.  The scope was withdrawn and the procedure completed. COMPLICATIONS: There were no complications.  ENDOSCOPIC IMPRESSION: 1.   Normal  mucosa in the terminal ileum 2.   Normal colon  RECOMMENDATIONS: Continue current meds and add glycopyyrolate 2 mg bid Await already drawn adalimumab studies will call  eSigned:  Gatha Mayer, MD, [facility] 08/25/2013 2:03 PM   cc: The Patient

## 2013-08-25 NOTE — Op Note (Signed)
[facility] [place], [address]   ENDOSCOPY PROCEDURE REPORT  PATIENT: Gary Barton, Gary Barton.  MR#: [id number] BIRTHDATE: 03-15-1970 , 42  yrs. old GENDER: Male ENDOSCOPIST: Gatha Mayer, MD, [facility] PROCEDURE DATE:  08/25/2013 PROCEDURE:  EGD, diagnostic ASA CLASS:     Class II INDICATIONS:  Dysphagia. MEDICATIONS: See Anesthesia Report. TOPICAL ANESTHETIC: none  DESCRIPTION OF PROCEDURE: After the risks benefits and alternatives of the procedure were thoroughly explained, informed consent was obtained.  The [place] [id number] endoscope was introduced through the mouth and advanced to the second portion of the duodenum. Without limitations.  The instrument was slowly withdrawn as the mucosa was fully examined.      The upper, middle and distal third of the esophagus were carefully inspected and no abnormalities were noted.  The z-line was well seen at the GEJ.  The endoscope was pushed into the fundus which was normal including a retroflexed view.  The antrum, gastric body, first and second part of the duodenum were unremarkable. Retroflexed views revealed no abnormalities.     The scope was then withdrawn from the patient and the procedure completed.  COMPLICATIONS: There were no complications. ENDOSCOPIC IMPRESSION: Normal EGD  RECOMMENDATIONS: Proceed with a Colonoscopy.  REPEAT EXAM:  eSigned:  Gatha Mayer, MD, [facility] 08/25/2013 1:56 PM   CC:The Patient

## 2013-08-25 NOTE — Anesthesia Postprocedure Evaluation (Signed)
  Anesthesia Post-op Note  Patient: Gary Barton  Procedure(s) Performed: Procedure(s): ESOPHAGOGASTRODUODENOSCOPY (EGD) (N/A) COLONOSCOPY (N/A)  Patient Location: PACU  Anesthesia Type:MAC  Level of Consciousness: awake, alert  and oriented  Airway and Oxygen Therapy: Patient Spontanous Breathing  Post-op Pain: none  Post-op Assessment: Post-op Vital signs reviewed  Post-op Vital Signs: Reviewed  Last Vitals:  Filed Vitals:   08/25/13 1354  BP: 106/74  Pulse: 50  Temp:   Resp: 16    Complications: No apparent anesthesia complications

## 2013-08-25 NOTE — Discharge Instructions (Signed)
Everything looks good today - you do have some small hemorrhoids.  I will call you when the rest of the labs return.  I have prescribed a medicine to help the stomach cramps and rectal pain called glycopyrrolate. Go to your pharmacy to pick up.  Hang in there.  I appreciate the opportunity to care for you. Gatha Mayer, MD, FACG  YOU HAD AN ENDOSCOPIC PROCEDURE TODAY: Refer to the procedure report and other information in the discharge instructions given to you for any specific questions about what was found during the examination. If this information does not answer your questions, please call Dr. Celesta Aver office at [phone] to clarify.   YOU SHOULD EXPECT: Some feelings of bloating in the abdomen. Passage of more gas than usual. Walking can help get rid of the air that was put into your GI tract during the procedure and reduce the bloating. If you had a lower endoscopy (such as a colonoscopy or flexible sigmoidoscopy) you may notice spotting of blood in your stool or on the toilet paper. Some abdominal soreness may be present for a day or two, also.  DIET: Your first meal following the procedure should be a light meal and then it is ok to progress to your normal diet. A half-sandwich or bowl of soup is an example of a good first meal. Heavy or fried foods are harder to digest and may make you feel nauseous or bloated. Drink plenty of fluids but you should avoid alcoholic beverages for 24 hours.   ACTIVITY: Your care partner should take you home directly after the procedure. You should plan to take it easy, moving slowly for the rest of the day. You can resume normal activity the day after the procedure however YOU SHOULD NOT DRIVE, use power tools, machinery or perform tasks that involve climbing or major physical exertion for 24 hours (because of the sedation medicines used during the test).   SYMPTOMS TO REPORT IMMEDIATELY: A gastroenterologist can be reached at any hour. Please call  [phone]  for any of the following symptoms:  Following lower endoscopy (colonoscopy, flexible sigmoidoscopy) Excessive amounts of blood in the stool  Significant tenderness, worsening of abdominal pains  Swelling of the abdomen that is new, acute  Fever of 100 or higher  Following upper endoscopy (EGD, EUS, ERCP, esophageal dilation) Vomiting of blood or coffee ground material  New, significant abdominal pain  New, significant chest pain or pain under the shoulder blades  Painful or persistently difficult swallowing  New shortness of breath  Black, tarry-looking or red, bloody stools  FOLLOW UP:  If any biopsies were taken you will be contacted by phone or by letter within the next 1-3 weeks. Call [phone]  if you have not heard about the biopsies in 3 weeks.  Please also call with any specific questions about appointments or follow up tests.

## 2013-08-25 NOTE — Anesthesia Preprocedure Evaluation (Addendum)
Anesthesia Evaluation  Patient identified by MRN, date of birth, ID band Patient awake    Reviewed: Allergy & Precautions, H&P , NPO status , Patient's Chart, lab work & pertinent test results, reviewed documented beta blocker date and time   Airway Mallampati: II TM Distance: >3 FB Neck ROM: Full    Dental  (+) Teeth Intact, Dental Advisory Given   Pulmonary former smoker,  breath sounds clear to auscultation        Cardiovascular negative cardio ROS  Rhythm:Regular Rate:Normal     Neuro/Psych  Headaches, PSYCHIATRIC DISORDERS Depression    GI/Hepatic GERD-  ,+Crohns dz with rectal bleeding and hx of hemorrhoids.   Endo/Other    Renal/GU Hx nephrolithiasis  negative genitourinary   Musculoskeletal negative musculoskeletal ROS (+)   Abdominal   Peds  Hematology negative hematology ROS (+)   Anesthesia Other Findings   Reproductive/Obstetrics negative OB ROS                          Anesthesia Physical Anesthesia Plan  ASA: III  Anesthesia Plan: MAC   Post-op Pain Management:    Induction: Intravenous  Airway Management Planned: Simple Face Mask  Additional Equipment: None  Intra-op Plan:   Post-operative Plan:   Informed Consent: I have reviewed the patients History and Physical, chart, labs and discussed the procedure including the risks, benefits and alternatives for the proposed anesthesia with the patient or authorized representative who has indicated his/her understanding and acceptance.     Plan Discussed with: CRNA and Surgeon  Anesthesia Plan Comments:         Anesthesia Quick Evaluation

## 2013-08-26 DIAGNOSIS — Z87891 Personal history of nicotine dependence: Secondary | ICD-10-CM | POA: Insufficient documentation

## 2013-08-26 DIAGNOSIS — I4891 Unspecified atrial fibrillation: Secondary | ICD-10-CM | POA: Insufficient documentation

## 2013-08-26 DIAGNOSIS — S79929A Unspecified injury of unspecified thigh, initial encounter: Secondary | ICD-10-CM

## 2013-08-26 DIAGNOSIS — S7290XA Unspecified fracture of unspecified femur, initial encounter for closed fracture: Secondary | ICD-10-CM | POA: Diagnosis not present

## 2013-08-26 DIAGNOSIS — Y9389 Activity, other specified: Secondary | ICD-10-CM | POA: Diagnosis not present

## 2013-08-26 DIAGNOSIS — S79919A Unspecified injury of unspecified hip, initial encounter: Secondary | ICD-10-CM | POA: Insufficient documentation

## 2013-08-26 DIAGNOSIS — Y92410 Unspecified street and highway as the place of occurrence of the external cause: Secondary | ICD-10-CM | POA: Diagnosis not present

## 2013-08-27 ENCOUNTER — Encounter (HOSPITAL_COMMUNITY): Payer: Self-pay | Admitting: Emergency Medicine

## 2013-08-27 ENCOUNTER — Emergency Department (HOSPITAL_COMMUNITY)
Admission: EM | Admit: 2013-08-27 | Discharge: 2013-09-26 | Disposition: E | Payer: Medicaid Other | Attending: Emergency Medicine | Admitting: Emergency Medicine

## 2013-08-28 ENCOUNTER — Encounter (HOSPITAL_COMMUNITY): Payer: Self-pay | Admitting: Internal Medicine

## 2013-09-04 LAB — ADALIMUMAB+AB (SERIAL MONITOR)
ADALIMUMAB DRUG LEVEL: 4.2 ug/mL
Anti-Adalimumab Antibody: <25 ng/mL

## 2013-09-26 NOTE — ED Notes (Signed)
Pt taken to Pod E, LSB, head blocks, c-collar, and all remaining clothing removed. Pt's belongings placed in 2 separate belonging bags And given to pt's spouse Reuel Boom.

## 2013-09-26 NOTE — Progress Notes (Signed)
Chaplain paged to be with family as they were informed of the news. Chaplain remained with family providing emotional and spiritual support and prayer. Pt's mother was also in the ED and Chaplain navigated both situations to care for the family. Family expressed gratitude the duration of the visit. One of their ministers was also present and invited the Chaplain to work alongside her with the family, as the situations escalated and additional family arrived.  Gary Barton
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
## 2013-09-26 NOTE — ED Notes (Signed)
Family at beside. Family given emotional support. 
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
## 2013-09-26 DEATH — deceased

## 2013-10-18 ENCOUNTER — Other Ambulatory Visit: Payer: Self-pay | Admitting: Internal Medicine

## 2013-10-18 NOTE — Telephone Encounter (Signed)
Called pharmacy to advise to stop automatic refill request, patient is deceased.
